# Patient Record
Sex: Female | Born: 1965
Health system: Southern US, Community
[De-identification: ages and names within clinical notes are randomized; demographics above are authoritative.]

## PROBLEM LIST (undated history)

## (undated) DIAGNOSIS — K449 Diaphragmatic hernia without obstruction or gangrene: Secondary | ICD-10-CM

## (undated) HISTORY — DX: Diaphragmatic hernia without obstruction or gangrene: K44.9

## (undated) HISTORY — PX: TUBAL LIGATION: SHX77

## (undated) HISTORY — PX: APPENDECTOMY: SHX54

---

## 2000-09-04 ENCOUNTER — Other Ambulatory Visit: Admission: RE | Admit: 2000-09-04 | Discharge: 2000-09-04 | Payer: Self-pay | Admitting: *Deleted

## 2000-09-15 ENCOUNTER — Encounter (INDEPENDENT_AMBULATORY_CARE_PROVIDER_SITE_OTHER): Payer: Self-pay | Admitting: *Deleted

## 2000-09-15 LAB — CONVERTED CEMR LAB

## 2000-12-20 ENCOUNTER — Encounter: Admission: RE | Admit: 2000-12-20 | Discharge: 2000-12-20 | Payer: Self-pay | Admitting: Family Medicine

## 2001-02-16 ENCOUNTER — Ambulatory Visit (HOSPITAL_COMMUNITY): Admission: RE | Admit: 2001-02-16 | Discharge: 2001-02-16 | Payer: Self-pay | Admitting: Gastroenterology

## 2001-02-16 ENCOUNTER — Encounter (INDEPENDENT_AMBULATORY_CARE_PROVIDER_SITE_OTHER): Payer: Self-pay | Admitting: Specialist

## 2002-04-04 ENCOUNTER — Other Ambulatory Visit: Admission: RE | Admit: 2002-04-04 | Discharge: 2002-04-04 | Payer: Self-pay | Admitting: *Deleted

## 2003-05-07 ENCOUNTER — Emergency Department (HOSPITAL_COMMUNITY): Admission: AC | Admit: 2003-05-07 | Discharge: 2003-05-07 | Payer: Self-pay

## 2003-11-17 ENCOUNTER — Encounter: Admission: RE | Admit: 2003-11-17 | Discharge: 2003-11-17 | Payer: Self-pay | Admitting: Gastroenterology

## 2006-10-13 ENCOUNTER — Encounter (INDEPENDENT_AMBULATORY_CARE_PROVIDER_SITE_OTHER): Payer: Self-pay | Admitting: *Deleted

## 2010-05-06 ENCOUNTER — Encounter: Admission: RE | Admit: 2010-05-06 | Discharge: 2010-05-06 | Payer: Self-pay | Admitting: Obstetrics and Gynecology

## 2010-12-31 NOTE — Procedures (Signed)
[facility]  Patient:    Alyssa Moody, Alyssa Moody                   MRN: [id number] Proc. Date: 02/16/01 Adm. Date:  [id number] Attending:  Nelda Marseille CC:         Sibyl Parr. Darrick Penna, M.D.   Procedure Report  PROCEDURE:  Esophagogastroduodenoscopy with biopsy.  INDICATION:  Patient with upper tract symptoms requesting work-up.  INFORMED CONSENT:  Consent was signed after risks, benefits, methods, and options were thoroughly discussed in the office.  MEDICINES USED:  Demerol 70, Versed 6.5.  DESCRIPTION OF PROCEDURE:  The video endoscope was inserted by direct vision. The esophagus was normal.  In the distal esophagus was a patent GE junction, possibly a tiny hiatal hernia was seen.  The scope passed easily into the stomach and advanced through a normal antrum, normal pylorus, into a normal duodenal bulb and around the c-loop into a normal second portion of the duodenum.  Interestingly enough, the ampulla was seen and was normal.  The scope was then slowly withdrawn back to the bulb, and a good look there ruled out abnormalities in that location.  The scope was withdrawn back to the stomach and retroflexed.  Two proximal gastric polyps were seen, tiny.  High in the cardia, possibly a tiny hiatal hernia was again seen.  No other abnormalities were seen on retroflexion with good look at the angularis, lesser and greater curve.  On straight visualization in the stomach, two additional more distal tiny polyps were seen.  All polyps mentioned above were cold biopsied x 1 or 2.  No additional findings were seen on straight visualization.  Again, the scope was slowly withdrawn, again confirming the normal esophagus.  The scope was removed.  The patient tolerated the procedure well.  There was no obvious immediate complications.  ENDOSCOPIC DIAGNOSES: 1. Patent gastroesophageal junction with questionably tiny hiatal hernia. 2. Four tiny gastric polyps, status  post cold biopsied. 3. Otherwise normal esophagogastroduodenoscopy.  PLAN:  Await pathology just to be sure.  Continue Prevacid since it is helping.  Follow up p.r.n. or in two months to recheck symptoms and make sure no further work-up plans are needed but probably would do an ultrasound next. DD:  02/16/01 TD:  02/16/01 Job: [id number]

## 2012-09-29 ENCOUNTER — Other Ambulatory Visit: Payer: Self-pay

## 2013-06-20 ENCOUNTER — Other Ambulatory Visit: Payer: Self-pay

## 2014-03-18 ENCOUNTER — Ambulatory Visit (INDEPENDENT_AMBULATORY_CARE_PROVIDER_SITE_OTHER): Payer: 59 | Admitting: Family Medicine

## 2014-03-18 VITALS — BP 120/76 | HR 73 | Temp 98.2°F | Resp 17 | Ht 62.5 in | Wt 136.0 lb

## 2014-03-18 DIAGNOSIS — Z131 Encounter for screening for diabetes mellitus: Secondary | ICD-10-CM

## 2014-03-18 DIAGNOSIS — Z0000 Encounter for general adult medical examination without abnormal findings: Secondary | ICD-10-CM

## 2014-03-18 DIAGNOSIS — K219 Gastro-esophageal reflux disease without esophagitis: Secondary | ICD-10-CM

## 2014-03-18 DIAGNOSIS — Z13220 Encounter for screening for lipoid disorders: Secondary | ICD-10-CM

## 2014-03-18 LAB — LIPID PANEL
Cholesterol: 191 mg/dL (ref 0–200)
HDL: 55 mg/dL (ref >39)
LDL Cholesterol: 115 mg/dL — ABNORMAL HIGH (ref 0–99)
Total CHOL/HDL Ratio: 3.5 Ratio
Triglycerides: 106 mg/dL (ref <150)
VLDL: 21 mg/dL (ref 0–40)

## 2014-03-18 LAB — POCT CBC
GRANULOCYTE PERCENT: 68.8 % (ref 37–80)
HCT, POC: 43.5 % (ref 37.7–47.9)
Hemoglobin: 14.3 g/dL (ref 12.2–16.2)
LYMPH, POC: 1.6 (ref 0.6–3.4)
MCH: 30.4 pg (ref 27–31.2)
MCHC: 32.9 g/dL (ref 31.8–35.4)
MCV: 92.3 fL (ref 80–97)
MID (CBC): 0.5 (ref 0–0.9)
MPV: 8.2 fL (ref 0–99.8)
PLATELET COUNT, POC: 253 10*3/uL (ref 142–424)
POC GRANULOCYTE: 4.7 (ref 2–6.9)
POC LYMPH %: 23.5 % (ref 10–50)
POC MID %: 7.7 % (ref 0–12)
RBC: 4.72 M/uL (ref 4.04–5.48)
RDW, POC: 13.5 %
WBC: 6.9 10*3/uL (ref 4.6–10.2)

## 2014-03-18 LAB — COMPREHENSIVE METABOLIC PANEL
ALT: 14 U/L (ref 0–35)
AST: 22 U/L (ref 0–37)
Albumin: 4.5 g/dL (ref 3.5–5.2)
Alkaline Phosphatase: 61 U/L (ref 39–117)
BUN: 12 mg/dL (ref 6–23)
CO2: 26 mEq/L (ref 19–32)
Calcium: 9.1 mg/dL (ref 8.4–10.5)
Chloride: 100 mEq/L (ref 96–112)
Creat: 0.77 mg/dL (ref 0.50–1.10)
Glucose, Bld: 80 mg/dL (ref 70–99)
Potassium: 3.6 mEq/L (ref 3.5–5.3)
Sodium: 135 mEq/L (ref 135–145)
Total Bilirubin: 0.5 mg/dL (ref 0.2–1.2)
Total Protein: 7.4 g/dL (ref 6.0–8.3)

## 2014-03-18 NOTE — Progress Notes (Addendum)
Subjective:    Patient ID: Alyssa Moody, female    DOB: 01/27/1966, 48 y.o.   MRN: [id number] This chart was scribed for Alyssa Staggers, MD by Julian Hy, ED Scribe. The patient was seen in Room 14. The patient's care was started at 2:18 PM.   Chief Complaint  Patient presents with  . Annual Exam    no pap requested     HPI HPI Comments: Alyssa Moody is a 48 y.o. female who presents to the [facility] for annual exam. Pt denies having any concerns. Pt reports she has past medical history of acid reflux, that she manages with diet. Pt reports 3-4 episodes/year. Pt reports she has self-caused vomiting about 3-4x/year.  Pt denies having a PCP. 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 Pt denies having a colonoscopy.  Pt reports fasting. Pt denies any history of anemia.   Health Maintenance  Cancer Screening Pt denies any family hx gastro, colon or breast cancer. Pt reports having a 01/2014 mammogram and pap smear with normal findings with OB/Gyn.  Immunizations Tetanus UTD. Pt reports her immunizations records are in [facility] records.   Exercise Pt states she doesn't regularly exercise.   Dentist Pt states she saw the dentist this morning for tooth cleaning.  Opthalmologist Pt denies seeing an opthalmologist. Pt reports she would like a referral to an ophthalmologist.   High Risk Problems Pt reports she is divorced. Pt has boyfriend. Pt reports she has tubal ligation. Pt denies using other forms of contraception.OB/GYN did STI check.   Review of Systems  Constitutional: Negative for fever and chills.  Respiratory: Negative for shortness of breath.   Gastrointestinal: Negative for nausea and vomiting.  Neurological: Negative for weakness.  All other systems reviewed and are negative.  Objective:   Physical Exam  Nursing note and vitals reviewed. Constitutional: She is oriented to person, place, and time. She  appears well-developed and well-nourished. No distress.  HENT:  Head: Normocephalic and atraumatic.  Right Ear: External ear normal.  Left Ear: External ear normal.  Mouth/Throat: Oropharynx is clear and moist.  Cerumen in both canals without complete obstruction.   Eyes: Conjunctivae and EOM are normal. Pupils are equal, round, and reactive to light.  Neck: Normal range of motion. Neck supple. No tracheal deviation present. No thyromegaly present.  Cardiovascular: Normal rate, regular rhythm, normal heart sounds and intact distal pulses.   No murmur heard. Pulmonary/Chest: Effort normal and breath sounds normal. No respiratory distress. She has no wheezes.  Breast exam and gyn exam deferred as under care of gynecologist who has performed these.  Abdominal: Soft. Bowel sounds are normal. There is no tenderness.  Musculoskeletal: Normal range of motion. She exhibits no edema and no tenderness.  Lymphadenopathy:    She has no cervical adenopathy.  Neurological: She is alert and oriented to person, place, and time.  Skin: Skin is warm and dry. No rash noted.  Psychiatric: She has a normal mood and affect. Her behavior is normal. Thought content normal.   Results for orders placed in visit on 03/18/14  POCT CBC      Result Value Ref Range   WBC 6.9  4.6 - 10.2 K/uL   Lymph, poc 1.6  0.6 - 3.4   POC LYMPH PERCENT 23.5  10 - 50 %L   MID (cbc) 0.5  0 - 0.9   POC MID % 7.7  0 - 12 %M  POC Granulocyte 4.7  2 - 6.9   Granulocyte percent 68.8  37 - 80 %G   RBC 4.72  4.04 - 5.48 M/uL   Hemoglobin 14.3  12.2 - 16.2 g/dL   HCT, POC 16.1  09.6 - 47.9 %   MCV 92.3  80 - 97 fL   MCH, POC 30.4  27 - 31.2 pg   MCHC 32.9  31.8 - 35.4 g/dL   RDW, POC 04.5     Platelet Count, POC 253  142 - 424 K/uL   MPV 8.2  0 - 99.8 fL     Assessment & Plan:  [name] is a 48 y.o. female Routine general medical examination at a health care facility  --anticipatory guidance as below in AVS, screening  labs above. Health maintenance items as above in HPI discussed/recommended as applicable.   Gastroesophageal reflux disease, esophagitis presence not specified - Plan: POCT CBC  -episodic.  Trigger avoidance discussed, and listed below, otc zantac if needed. rtc precautions.   Screening for diabetes mellitus (DM) - Plan: Comprehensive metabolic panel  Screening for hyperlipidemia - Plan: Lipid panel   No orders of the defined types were placed in this encounter.   Patient Instructions  Tetanus (tdap) needed if not within 10 years.  We will refer you to ophthalmologist for evaluation of vision and health of your eyes. Exercise 150 minutes per week.  Foods to avoid with heartburn below.  Debrox if needed for the wax in your ears, but return if this does not clear your symptoms in 1-2 days.  Return to the clinic or go to the nearest emergency room if any of your symptoms worsen or new symptoms occur.  Keeping You Healthy  Get These Tests 1. Blood Pressure- Have your blood pressure checked once a year by your health care provider.  Normal blood pressure is 120/80. 2. Weight- Have your body mass index (BMI) calculated to screen for obesity.  BMI is measure of body fat based on height and weight.  You can also calculate your own BMI at https://www.west-esparza.com/. 3. Cholesterol- Have your cholesterol checked every 5 years starting at age 71 then yearly starting at age 25. 4. Chlamydia, HIV, and other sexually transmitted diseases- Get screened every year until age 71, then within three months of each new sexual provider. 5. Pap Smear- Every 1-3 years; discuss with your health care provider. 6. Mammogram- Every year starting at age 24  Take these medicines  Calcium with Vitamin D-Your body needs 1200 mg of Calcium each day and [phone] IU of Vitamin D daily.  Your body can only absorb 500 mg of Calcium at a time so Calcium must be taken in 2 or 3 divided doses throughout the  day.  Multivitamin with folic acid- Once daily if it is possible for you to become pregnant.  Get these Immunizations  Gardasil-Series of three doses; prevents HPV related illness such as genital warts and cervical cancer.  Menactra-Single dose; prevents meningitis.  Tetanus shot- Every 10 years.  Flu shot-Every year.  Take these steps 1. Do not smoke-Your healthcare provider can help you quit.  For tips on how to quit go to www.smokefree.gov or call 1-800 QUITNOW. 2. Be physically active- Exercise 5 days a week for at least 30 minutes.  If you are not already physically active, start slow and gradually work up to 30 minutes of moderate physical activity.  Examples of moderate activity include walking briskly, dancing, swimming, bicycling, etc. 3. Breast  Cancer- A self breast exam every month is important for early detection of breast cancer.  For more information and instruction on self breast exams, ask your healthcare provider or [place].es. 4. Eat a healthy diet- Eat a variety of healthy foods such as fruits, vegetables, whole grains, low fat milk, low fat cheeses, yogurt, lean meats, poultry and fish, beans, nuts, tofu, etc.  For more information go to www. Thenutritionsource.org 5. Drink alcohol in moderation- Limit alcohol intake to one drink or less per day. Never drink and drive. 6. Depression- Your emotional health is as important as your physical health.  If you're feeling down or losing interest in things you normally enjoy please talk to your healthcare provider about being screened for depression. 7. Dental visit- Brush and floss your teeth twice daily; visit your dentist twice a year. 8. Eye doctor- Get an eye exam at least every 2 years. 9. Helmet use- Always wear a helmet when riding a bicycle, motorcycle, rollerblading or skateboarding. 10. Safe sex- If you may be exposed to sexually transmitted infections, use a condom. 11. Seat belts-  Seat belts can save your live; always wear one. 12. Smoke/Carbon Monoxide detectors- These detectors need to be installed on the appropriate level of your home. Replace batteries at least once a year. 13. Skin cancer- When out in the sun please cover up and use sunscreen 15 SPF or higher. 14. Violence- If anyone is threatening or hurting you, please tell your healthcare provider.  Food Choices for Gastroesophageal Reflux Disease When you have gastroesophageal reflux disease (GERD), the foods you eat and your eating habits are very important. Choosing the right foods can help ease the discomfort of GERD. WHAT GENERAL GUIDELINES DO I NEED TO FOLLOW?  Choose fruits, vegetables, whole grains, low-fat dairy products, and low-fat meat, fish, and poultry.  Limit fats such as oils, salad dressings, butter, nuts, and avocado.  Keep a food diary to identify foods that cause symptoms.  Avoid foods that cause reflux. These may be different for different people.  Eat frequent small meals instead of three large meals each day.  Eat your meals slowly, in a relaxed setting.  Limit fried foods.  Cook foods using methods other than frying.  Avoid drinking alcohol.  Avoid drinking large amounts of liquids with your meals.  Avoid bending over or lying down until 2-3 hours after eating. WHAT FOODS ARE NOT RECOMMENDED? The following are some foods and drinks that may worsen your symptoms: Vegetables Tomatoes. Tomato juice. Tomato and spaghetti sauce. Chili peppers. Onion and garlic. Horseradish. Fruits Oranges, grapefruit, and lemon (fruit and juice). Meats High-fat meats, fish, and poultry. This includes hot dogs, ribs, ham, sausage, salami, and bacon. Dairy Whole milk and chocolate milk. Sour cream. Cream. Butter. Ice cream. Cream cheese.  Beverages Coffee and tea, with or without caffeine. Carbonated beverages or energy drinks. Condiments Hot sauce. Barbecue sauce.   Sweets/Desserts Chocolate and cocoa. Donuts. Peppermint and spearmint. Fats and Oils High-fat foods, including [place] fries and potato chips. Other Vinegar. Strong spices, such as black pepper, white pepper, red pepper, cayenne, curry powder, cloves, ginger, and chili powder. The items listed above may not be a complete list of foods and beverages to avoid. Contact your dietitian for more information. Document Released: 08/01/2005 Document Revised: 08/06/2013 Document Reviewed: 06/05/2013 [facility] Patient Information 2015 [place], [place]. This information is not intended to replace advice given to you by your health care provider. Make sure you discuss any questions you have with your  health care provider.      I personally performed the services described in this documentation, which was scribed in my presence. The recorded information has been reviewed and considered, and addended by me as needed.

## 2014-03-18 NOTE — Patient Instructions (Addendum)
Tetanus (tdap) needed if not within 10 years.  We will refer you to ophthalmologist for evaluation of vision and health of your eyes. Exercise 150 minutes per week.  Foods to avoid with heartburn below.  Debrox if needed for the wax in your ears, but return if this does not clear your symptoms in 1-2 days.  Return to the clinic or go to the nearest emergency room if any of your symptoms worsen or new symptoms occur.  Keeping You Healthy  Get These Tests 1. Blood Pressure- Have your blood pressure checked once a year by your health care provider.  Normal blood pressure is 120/80. 2. Weight- Have your body mass index (BMI) calculated to screen for obesity.  BMI is measure of body fat based on height and weight.  You can also calculate your own BMI at https://www.west-esparza.com/www.nhlbisupport.com/bmi/. 3. Cholesterol- Have your cholesterol checked every 5 years starting at age 48 then yearly starting at age 48. 4. Chlamydia, HIV, and other sexually transmitted diseases- Get screened every year until age 48, then within three months of each new sexual provider. 5. Pap Smear- Every 1-3 years; discuss with your health care provider. 6. Mammogram- Every year starting at age 48  Take these medicines  Calcium with Vitamin D-Your body needs 1200 mg of Calcium each day and [phone] IU of Vitamin D daily.  Your body can only absorb 500 mg of Calcium at a time so Calcium must be taken in 2 or 3 divided doses throughout the day.  Multivitamin with folic acid- Once daily if it is possible for you to become pregnant.  Get these Immunizations  Gardasil-Series of three doses; prevents HPV related illness such as genital warts and cervical cancer.  Menactra-Single dose; prevents meningitis.  Tetanus shot- Every 10 years.  Flu shot-Every year.  Take these steps 1. Do not smoke-Your healthcare provider can help you quit.  For tips on how to quit go to www.smokefree.gov or call 1-800 QUITNOW. 2. Be physically active- Exercise 5  days a week for at least 30 minutes.  If you are not already physically active, start slow and gradually work up to 30 minutes of moderate physical activity.  Examples of moderate activity include walking briskly, dancing, swimming, bicycling, etc. 3. Breast Cancer- A self breast exam every month is important for early detection of breast cancer.  For more information and instruction on self breast exams, ask your healthcare provider or [place].eswww.womenshealth.gov/faq/breast-self-exam.cfm. 4. Eat a healthy diet- Eat a variety of healthy foods such as fruits, vegetables, whole grains, low fat milk, low fat cheeses, yogurt, lean meats, poultry and fish, beans, nuts, tofu, etc.  For more information go to www. Thenutritionsource.org 5. Drink alcohol in moderation- Limit alcohol intake to one drink or less per day. Never drink and drive. 6. Depression- Your emotional health is as important as your physical health.  If you're feeling down or losing interest in things you normally enjoy please talk to your healthcare provider about being screened for depression. 7. Dental visit- Brush and floss your teeth twice daily; visit your dentist twice a year. 8. Eye doctor- Get an eye exam at least every 2 years. 9. Helmet use- Always wear a helmet when riding a bicycle, motorcycle, rollerblading or skateboarding. 10. Safe sex- If you may be exposed to sexually transmitted infections, use a condom. 11. Seat belts- Seat belts can save your live; always wear one. 12. Smoke/Carbon Monoxide detectors- These detectors need to be installed on the appropriate level of  your home. Replace batteries at least once a year. 13. Skin cancer- When out in the sun please cover up and use sunscreen 15 SPF or higher. 14. Violence- If anyone is threatening or hurting you, please tell your healthcare provider.  Food Choices for Gastroesophageal Reflux Disease When you have gastroesophageal reflux disease (GERD), the foods you eat and your  eating habits are very important. Choosing the right foods can help ease the discomfort of GERD. WHAT GENERAL GUIDELINES DO I NEED TO FOLLOW?  Choose fruits, vegetables, whole grains, low-fat dairy products, and low-fat meat, fish, and poultry.  Limit fats such as oils, salad dressings, butter, nuts, and avocado.  Keep a food diary to identify foods that cause symptoms.  Avoid foods that cause reflux. These may be different for different people.  Eat frequent small meals instead of three large meals each day.  Eat your meals slowly, in a relaxed setting.  Limit fried foods.  Cook foods using methods other than frying.  Avoid drinking alcohol.  Avoid drinking large amounts of liquids with your meals.  Avoid bending over or lying down until 2-3 hours after eating. WHAT FOODS ARE NOT RECOMMENDED? The following are some foods and drinks that may worsen your symptoms: Vegetables Tomatoes. Tomato juice. Tomato and spaghetti sauce. Chili peppers. Onion and garlic. Horseradish. Fruits Oranges, grapefruit, and lemon (fruit and juice). Meats High-fat meats, fish, and poultry. This includes hot dogs, ribs, ham, sausage, salami, and bacon. Dairy Whole milk and chocolate milk. Sour cream. Cream. Butter. Ice cream. Cream cheese.  Beverages Coffee and tea, with or without caffeine. Carbonated beverages or energy drinks. Condiments Hot sauce. Barbecue sauce.  Sweets/Desserts Chocolate and cocoa. Donuts. Peppermint and spearmint. Fats and Oils High-fat foods, including [place] fries and potato chips. Other Vinegar. Strong spices, such as black pepper, white pepper, red pepper, cayenne, curry powder, cloves, ginger, and chili powder. The items listed above may not be a complete list of foods and beverages to avoid. Contact your dietitian for more information. Document Released: 08/01/2005 Document Revised: 08/06/2013 Document Reviewed: 06/05/2013 [facility] Patient Information 2015  [place], [place]. This information is not intended to replace advice given to you by your health care provider. Make sure you discuss any questions you have with your health care provider.

## 2014-03-28 ENCOUNTER — Encounter: Payer: Self-pay | Admitting: *Deleted

## 2015-05-04 ENCOUNTER — Ambulatory Visit (INDEPENDENT_AMBULATORY_CARE_PROVIDER_SITE_OTHER): Payer: 59 | Admitting: Family Medicine

## 2015-05-04 ENCOUNTER — Encounter: Payer: Self-pay | Admitting: Family Medicine

## 2015-05-04 VITALS — BP 107/67 | HR 76 | Temp 98.5°F | Resp 16 | Ht 62.5 in | Wt 141.0 lb

## 2015-05-04 DIAGNOSIS — Z0000 Encounter for general adult medical examination without abnormal findings: Secondary | ICD-10-CM

## 2015-05-04 DIAGNOSIS — M25569 Pain in unspecified knee: Secondary | ICD-10-CM

## 2015-05-04 DIAGNOSIS — R5383 Other fatigue: Secondary | ICD-10-CM

## 2015-05-04 DIAGNOSIS — H53143 Visual discomfort, bilateral: Secondary | ICD-10-CM | POA: Diagnosis not present

## 2015-05-04 DIAGNOSIS — Z130 Encounter for screening for diseases of the blood and blood-forming organs and certain disorders involving the immune mechanism: Secondary | ICD-10-CM

## 2015-05-04 DIAGNOSIS — Z13220 Encounter for screening for lipoid disorders: Secondary | ICD-10-CM | POA: Diagnosis not present

## 2015-05-04 DIAGNOSIS — Z131 Encounter for screening for diabetes mellitus: Secondary | ICD-10-CM | POA: Diagnosis not present

## 2015-05-04 DIAGNOSIS — H5310 Unspecified subjective visual disturbances: Secondary | ICD-10-CM

## 2015-05-04 DIAGNOSIS — G4726 Circadian rhythm sleep disorder, shift work type: Secondary | ICD-10-CM

## 2015-05-04 NOTE — Patient Instructions (Addendum)
Your shift work and loss of sleep may be a factor with your fatigue and feeling down. I would recommend changing to either nights or days - not mixing the schedule. I will ceck other tests on blood work when you can return for fasting bloodwork.  You should receive a call or letter about your lab results within the next week to 10 days.   Try stretching for leg pain and exercise as discussed.  If this is not helping in next few weeks - return for recheck.  Your memory symptoms/retention and keeping up may be due to sleep difficulty, as well as stress or depression symptoms.  You need more regular sleep as we discussed and see information on stress management below. If you do feel depressed or these symptoms persist - return for recheck.   I will refer you to eye doctor as discussed   Fatigue Fatigue is a feeling of tiredness, lack of energy, lack of motivation, or feeling tired all the time. Having enough rest, good nutrition, and reducing stress will normally reduce fatigue. Consult your caregiver if it persists. The nature of your fatigue will help your caregiver to find out its cause. The treatment is based on the cause.  CAUSES  There are many causes for fatigue. Most of the time, fatigue can be traced to one or more of your habits or routines. Most causes fit into one or more of three general areas. They are: Lifestyle problems  Sleep disturbances.  Overwork.  Physical exertion.  Unhealthy habits.  Poor eating habits or eating disorders.  Alcohol and/or drug use .  Lack of proper nutrition (malnutrition). Psychological problems 1. Stress and/or anxiety problems. 2. Depression. 3. Grief. 4. Boredom. Medical Problems or Conditions 1. Anemia. 2. Pregnancy. 3. Thyroid gland problems. 4. Recovery from major surgery. 5. Continuous pain. 6. Emphysema or asthma that is not well controlled 7. Allergic conditions. 8. Diabetes. 9. Infections (such as  mononucleosis). 10. Obesity. 11. Sleep disorders, such as sleep apnea. 12. Heart failure or other heart-related problems. 13. Cancer. 14. Kidney disease. 15. Liver disease. 16. Effects of certain medicines such as antihistamines, cough and cold remedies, prescription pain medicines, heart and blood pressure medicines, drugs used for treatment of cancer, and some antidepressants. SYMPTOMS  The symptoms of fatigue include:  1. Lack of energy. 2. Lack of drive (motivation). 3. Drowsiness. 4. Feeling of indifference to the surroundings. DIAGNOSIS  The details of how you feel help guide your caregiver in finding out what is causing the fatigue. You will be asked about your present and past health condition. It is important to review all medicines that you take, including prescription and non-prescription items. A thorough exam will be done. You will be questioned about your feelings, habits, and normal lifestyle. Your caregiver may suggest blood tests, urine tests, or other tests to look for common medical causes of fatigue.  TREATMENT  Fatigue is treated by correcting the underlying cause. For example, if you have continuous pain or depression, treating these causes will improve how you feel. Similarly, adjusting the dose of certain medicines will help in reducing fatigue.  HOME CARE INSTRUCTIONS   Try to get the required amount of good sleep every night.  Eat a healthy and nutritious diet, and drink enough water throughout the day.  Practice ways of relaxing (including yoga or meditation).  Exercise regularly.  Make plans to change situations that cause stress. Act on those plans so that stresses decrease over time. Keep your work  and personal routine reasonable.  Avoid street drugs and minimize use of alcohol.  Start taking a daily multivitamin after consulting your caregiver. SEEK MEDICAL CARE IF:  1. You have persistent tiredness, which cannot be accounted for. 2. You have  fever. 3. You have unintentional weight loss. 4. You have headaches. 5. You have disturbed sleep throughout the night. 6. You are feeling sad. 7. You have constipation. 8. You have dry skin. 9. You have gained weight. 10. You are taking any new or different medicines that you suspect are causing fatigue. 11. You are unable to sleep at night. 12. You develop any unusual swelling of your legs or other parts of your body. SEEK IMMEDIATE MEDICAL CARE IF:   You are feeling confused.  Your vision is blurred.  You feel faint or pass out.  You develop severe headache.  You develop severe abdominal, pelvic, or back pain.  You develop chest pain, shortness of breath, or an irregular or fast heartbeat.  You are unable to pass a normal amount of urine.  You develop abnormal bleeding such as bleeding from the rectum or you vomit blood.  You have thoughts about harming yourself or committing suicide.  You are worried that you might harm someone else. MAKE SURE YOU:   Understand these instructions.  Will watch your condition.  Will get help right away if you are not doing well or get worse. Document Released: 05/29/2007 Document Revised: 10/24/2011 Document Reviewed: 12/03/2013 [facility] Patient Information 2015 [place], [place]. This information is not intended to replace advice given to you by your health care provider. Make sure you discuss any questions you have with your health care provider.  Stress and Stress Management Stress is a normal reaction to life events. It is what you feel when life demands more than you are used to or more than you can handle. Some stress can be useful. For example, the stress reaction can help you catch the last bus of the day, study for a test, or meet a deadline at work. But stress that occurs too often or for too long can cause problems. It can affect your emotional health and interfere with relationships and normal daily activities. Too much stress  can weaken your immune system and increase your risk for physical illness. If you already have a medical problem, stress can make it worse. CAUSES  All sorts of life events may cause stress. An event that causes stress for one person may not be stressful for another person. Major life events commonly cause stress. These may be positive or negative. Examples include losing your job, moving into a new home, getting married, having a baby, or losing a loved one. Less obvious life events may also cause stress, especially if they occur day after day or in combination. Examples include working long hours, driving in traffic, caring for children, being in debt, or being in a difficult relationship. SIGNS AND SYMPTOMS Stress may cause emotional symptoms including, the following:  Anxiety. This is feeling worried, afraid, on edge, overwhelmed, or out of control.  Anger. This is feeling irritated or impatient.  Depression. This is feeling sad, down, helpless, or guilty.  Difficulty focusing, remembering, or making decisions. Stress may cause physical symptoms, including the following:  5. Aches and pains. These may affect your head, neck, back, stomach, or other areas of your body. 6. Tight muscles or clenched jaw. 7. Low energy or trouble sleeping. Stress may cause unhealthy behaviors, including the following:  17. Eating  to feel better (overeating) or skipping meals. 18. Sleeping too little, too much, or both. 52. Working too much or putting off tasks (procrastination). 20. Smoking, drinking alcohol, or using drugs to feel better. DIAGNOSIS  Stress is diagnosed through an assessment by your health care provider. Your health care provider will ask questions about your symptoms and any stressful life events.Your health care provider will also ask about your medical history and may order blood tests or other tests. Certain medical conditions and medicine can cause physical symptoms similar to stress.  Mental illness can cause emotional symptoms and unhealthy behaviors similar to stress. Your health care provider may refer you to a mental health professional for further evaluation.  TREATMENT  Stress management is the recommended treatment for stress.The goals of stress management are reducing stressful life events and coping with stress in healthy ways.  Techniques for reducing stressful life events include the following: 5. Stress identification. Self-monitor for stress and identify what causes stress for you. These skills may help you to avoid some stressful events. 6. Time management. Set your priorities, keep a calendar of events, and learn to say "no." These tools can help you avoid making too many commitments. Techniques for coping with stress include the following:  Rethinking the problem. Try to think realistically about stressful events rather than ignoring them or overreacting. Try to find the positives in a stressful situation rather than focusing on the negatives.  Exercise. Physical exercise can release both physical and emotional tension. The key is to find a form of exercise you enjoy and do it regularly.  Relaxation techniques. These relax the body and mind. Examples include yoga, meditation, tai chi, biofeedback, deep breathing, progressive muscle relaxation, listening to music, being out in nature, journaling, and other hobbies. Again, the key is to find one or more that you enjoy and can do regularly.  Healthy lifestyle. Eat a balanced diet, get plenty of sleep, and do not smoke. Avoid using alcohol or drugs to relax.  Strong support network. Spend time with family, friends, or other people you enjoy being around.Express your feelings and talk things over with someone you trust. Counseling or talktherapy with a mental health professional may be helpful if you are having difficulty managing stress on your own. Medicine is typically not recommended for the treatment of  stress.Talk to your health care provider if you think you need medicine for symptoms of stress. HOME CARE INSTRUCTIONS 13. Keep all follow-up visits as directed by your health care provider. 14. Take all medicines as directed by your health care provider. SEEK MEDICAL CARE IF:  Your symptoms get worse or you start having new symptoms.  You feel overwhelmed by your problems and can no longer manage them on your own. SEEK IMMEDIATE MEDICAL CARE IF:  You feel like hurting yourself or someone else. Document Released: 01/25/2001 Document Revised: 12/16/2013 Document Reviewed: 03/26/2013 [facility] Patient Information 2015 [place], [place]. This information is not intended to replace advice given to you by your health care provider. Make sure you discuss any questions you have with your health care provider.  Keeping You Healthy  Get These Tests 8. Blood Pressure- Have your blood pressure checked once a year by your health care provider.  Normal blood pressure is 120/80. 9. Weight- Have your body mass index (BMI) calculated to screen for obesity.  BMI is measure of body fat based on height and weight.  You can also calculate your own BMI at [employer]velB[employer].[email or web address]. 10. Cholesterol- Have your cholesterol  checked every 5 years starting at age 46 then yearly starting at age 39. 40. Chlamydia, HIV, and other sexually transmitted diseases- Get screened every year until age 18, then within three months of each new sexual provider. 12. Pap Test - Every 1-5 years; discuss with your health care provider. 71. Mammogram- Every 1-2 years starting at age 50--50  Take these medicines  Calcium with Vitamin D-Your body needs 1200 mg of Calcium each day and [phone] IU of Vitamin D daily.  Your body can only absorb 500 mg of Calcium at a time so Calcium must be taken in 2 or 3 divided doses throughout the day.  Multivitamin with folic acid- Once daily if it is possible for you to become pregnant.  Get these  Immunizations  Gardasil-Series of three doses; prevents HPV related illness such as genital warts and cervical cancer.  Menactra-Single dose; prevents meningitis.  Tetanus shot- Every 10 years.  Flu shot-Every year.  Take these steps 15. Do not smoke-Your healthcare provider can help you quit.  For tips on how to quit go to www.smokefree.gov or call 1-800 QUITNOW. 16. Be physically active- Exercise 5 days a week for at least 30 minutes.  If you are not already physically active, start slow and gradually work up to 30 minutes of moderate physical activity.  Examples of moderate activity include walking briskly, dancing, swimming, bicycling, etc. 17. Breast Cancer- A self breast exam every month is important for early detection of breast cancer.  For more information and instruction on self breast exams, ask your healthcare provider or https://www.patel.info/. 18. Eat a healthy diet- Eat a variety of healthy foods such as fruits, vegetables, whole grains, low fat milk, low fat cheeses, yogurt, lean meats, poultry and fish, beans, nuts, tofu, etc.  For more information go to www. Thenutritionsource.org 19. Drink alcohol in moderation- Limit alcohol intake to one drink or less per day. Never drink and drive. 51. Depression- Your emotional health is as important as your physical health.  If you're feeling down or losing interest in things you normally enjoy please talk to your healthcare provider about being screened for depression. 21. Dental visit- Brush and floss your teeth twice daily; visit your dentist twice a year. 71. Eye doctor- Get an eye exam at least every 2 years. 23. Helmet use- Always wear a helmet when riding a bicycle, motorcycle, rollerblading or skateboarding. 24. Safe sex- If you may be exposed to sexually transmitted infections, use a condom. 25. Seat belts- Seat belts can save your live; always wear one. 26. Smoke/Carbon Monoxide detectors- These detectors  need to be installed on the appropriate level of your home. Replace batteries at least once a year. 27. Skin cancer- When out in the sun please cover up and use sunscreen 15 SPF or higher. 28. Violence- If anyone is threatening or hurting you, please tell your healthcare provider.

## 2015-05-04 NOTE — Progress Notes (Signed)
Subjective:  This chart was scribed for [name], MD by Moises Blood, Medical Scribe. This patient was seen in room 26 and the patient's care was started 10:30 AM.    Patient ID: Alyssa Moody, female    DOB: 1965/08/20, 49 y.o.   MRN: [id number]  HPI Alyssa Moody is a 49 y.o. female Here for physical exam Last physical was in Aug 2015  Cancer Screening Pap testing by OB GYN, nl last year, repeat 3 years No Fhx of GI/colon/breast cancer Last mammogram: done earlier this year  Immunizations Reportedly up to date at last physical Works for [employer] She received a flu shot last week.   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   Exercise Recommended last year as she had not had any regular exercise at that time. She's working a lot and she's on her feet a lot.   Fatigue She works Monday to Friday day shift, and Saturday-Sunday night shift. She's been feeling more fatigue since last few years. She's not sleeping well, about 4 hours after Saturday shift, and 3 hours after Sunday shift.  Wt Readings from Last 3 Encounters:  05/04/15 141 lb (63.957 kg)  03/18/14 136 lb (61.689 kg)   She stress eats snacks when she gets stressed.   Leg Pain Her father had vein problems. She has had leg pains for a while (maybe up to 5 years), more on the back of her left leg hurts more than right leg. Last night, she had pain, she put on compression socks and felt better.   Eye Referred to Ophthalmology last year. She denies seeing ophthalmology. She started using reading glasses because it's hard to see up close. When she reads a lot even with the glasses, she gets tired. She can see far.   Depression Depression screen [facility] 2/9 05/04/2015  Decreased Interest 0  Down, Depressed, Hopeless 0  PHQ - 2 Score 0   She says she feels down sometimes due to stress. She denies major depression.     GERD Episodic when discussed last year, trigger avoidance was discussed  HLD Borderline elevated last year Lab Results  Component Value Date   CHOL 191 03/18/2014   HDL 55 03/18/2014   LDLCALC 115* 03/18/2014   TRIG 106 03/18/2014   CHOLHDL 3.5 03/18/2014  She's not fasting today. She worked last night.    There are no active problems to display for this patient.  Past Medical History  Diagnosis Date  . Hiatal hernia    Past Surgical History  Procedure Laterality Date  . Appendectomy    . Tubal ligation     No Known Allergies Prior to Admission medications   Not on File   Social History   Social History  . Marital Status: Single    Spouse Name: N/A  . Number of Children: N/A  . Years of Education: N/A   Occupational History  . Not on file.   Social History Main Topics  . Smoking status: Never Smoker   . Smokeless tobacco: Not on file  . Alcohol Use: No  . Drug Use: No  . Sexual Activity: No   Other Topics Concern  . Not on file   Social History Narrative     Review of Systems  Cardiovascular: Negative for chest pain and palpitations.   13 point ROS per survey. All negative except above  Objective:   Physical Exam  Constitutional: She is oriented to person, place, and time. She appears well-developed and well-nourished. No distress.  HENT:  Head: Normocephalic and atraumatic.  Right Ear: Hearing, tympanic membrane, external ear and ear canal normal.  Left Ear: Hearing, tympanic membrane, external ear and ear canal normal.  Nose: Nose normal.  Mouth/Throat: Oropharynx is clear and moist. No oropharyngeal exudate.  Eyes: Conjunctivae and EOM are normal. Pupils are equal, round, and reactive to light.  Neck: No thyromegaly present.  Cardiovascular: Normal rate, regular rhythm, normal heart sounds and intact distal pulses.   No murmur heard. Pulmonary/Chest: Effort normal and breath sounds normal. No respiratory distress. She has no wheezes.  She has no rhonchi.  Musculoskeletal:  Calves no edema, neg homans, calves non tender right knee: no effusion, neg mcmurray, full ROM left knee: no effusion, neg mcmurray, full ROM slight tight hamstrings  Neurological: She is alert and oriented to person, place, and time.  Skin: Skin is warm and dry. No rash noted.  Psychiatric: She has a normal mood and affect. Her behavior is normal.  Vitals reviewed.   Filed Vitals:   05/04/15 0932  BP: 107/67  Pulse: 76  Temp: 98.5 F (36.9 C)  Resp: 16  Height: 5' 2.5" (1.588 m)  Weight: 141 lb (63.957 kg)    Visual Acuity Screening   Right eye Left eye Both eyes  Without correction: _0  With correction:            Assessment & Plan:   By signing my name below, I, Moises Blood, attest that this documentation has been prepared under the direction and in the presence of [name], MD. Electronically Signed: Moises Blood, Scribe. 05/04/2015 , 10:30 AM . Alyssa Moody is a 49 y.o. female Shifting sleep-work schedule  -Suspect this may be contributing to multiple symptoms including fatigue, and stressors.  Discussed importance of regular sleep schedule, and I am concerned of her switching from days to nights each week. Recommended she try to decrease the amount of night shifts and if unable to do so, would need more sleep after these shifts.  Annual physical exam  --anticipatory guidance as below in AVS, screening labs above. Health maintenance items as above in HPI discussed/recommended as applicable.   Other fatigue - Plan: TSH, COMPLETE METABOLIC PANEL WITH GFR, CBC  - Labs pending, but suspect sleep disturbance with shifting sleep work schedule as primary cause. Also discussed stress and stress management, and other info on sleep as above.   Screening for diabetes mellitus - Plan: COMPLETE METABOLIC PANEL WITH GFR   Screening for hyperlipidemia - Plan: Lipid panel  Screening, anemia, deficiency, iron -  Plan: CBC  Eye fatigue, bilateral - Plan: Ambulatory referral to Ophthalmology  -Refer to operative for screening visual exam, but suspect some this fatigue is due to farsightedness. Okay to continue use of readers as needed.  Pain in joint, lower leg, unspecified laterality  - Nonfocal exam. If persistent after increasing sleep, as well as regular exercise and stretches, follow-up to discuss further and further evaluation.  No orders of the defined types were placed in this encounter.   Patient Instructions  Your shift work and loss of sleep may be a factor with your fatigue and feeling down. I would recommend changing to either nights or days - not mixing the schedule. I will ceck other tests on blood work when you can return for fasting bloodwork.  You should receive a  call or letter about your lab results within the next week to 10 days.   Try stretching for leg pain and exercise as discussed.  If this is not helping in next few weeks - return for recheck.  Your memory symptoms/retention and keeping up may be due to sleep difficulty, as well as stress or depression symptoms.  You need more regular sleep as we discussed and see information on stress management below. If you do feel depressed or these symptoms persist - return for recheck.   I will refer you to eye doctor as discussed   Fatigue Fatigue is a feeling of tiredness, lack of energy, lack of motivation, or feeling tired all the time. Having enough rest, good nutrition, and reducing stress will normally reduce fatigue. Consult your caregiver if it persists. The nature of your fatigue will help your caregiver to find out its cause. The treatment is based on the cause.  CAUSES  There are many causes for fatigue. Most of the time, fatigue can be traced to one or more of your habits or routines. Most causes fit into one or more of three general areas. They are: Lifestyle problems  Sleep disturbances.  Overwork.  Physical  exertion.  Unhealthy habits.  Poor eating habits or eating disorders.  Alcohol and/or drug use .  Lack of proper nutrition (malnutrition). Psychological problems 1. Stress and/or anxiety problems. 2. Depression. 3. Grief. 4. Boredom. Medical Problems or Conditions 1. Anemia. 2. Pregnancy. 3. Thyroid gland problems. 4. Recovery from major surgery. 5. Continuous pain. 6. Emphysema or asthma that is not well controlled 7. Allergic conditions. 8. Diabetes. 9. Infections (such as mononucleosis). 10. Obesity. 11. Sleep disorders, such as sleep apnea. 12. Heart failure or other heart-related problems. 13. Cancer. 14. Kidney disease. 15. Liver disease. 16. Effects of certain medicines such as antihistamines, cough and cold remedies, prescription pain medicines, heart and blood pressure medicines, drugs used for treatment of cancer, and some antidepressants. SYMPTOMS  The symptoms of fatigue include:  1. Lack of energy. 2. Lack of drive (motivation). 3. Drowsiness. 4. Feeling of indifference to the surroundings. DIAGNOSIS  The details of how you feel help guide your caregiver in finding out what is causing the fatigue. You will be asked about your present and past health condition. It is important to review all medicines that you take, including prescription and non-prescription items. A thorough exam will be done. You will be questioned about your feelings, habits, and normal lifestyle. Your caregiver may suggest blood tests, urine tests, or other tests to look for common medical causes of fatigue.  TREATMENT  Fatigue is treated by correcting the underlying cause. For example, if you have continuous pain or depression, treating these causes will improve how you feel. Similarly, adjusting the dose of certain medicines will help in reducing fatigue.  HOME CARE INSTRUCTIONS   Try to get the required amount of good sleep every night.  Eat a healthy and nutritious diet, and drink  enough water throughout the day.  Practice ways of relaxing (including yoga or meditation).  Exercise regularly.  Make plans to change situations that cause stress. Act on those plans so that stresses decrease over time. Keep your work and personal routine reasonable.  Avoid street drugs and minimize use of alcohol.  Start taking a daily multivitamin after consulting your caregiver. SEEK MEDICAL CARE IF:  1. You have persistent tiredness, which cannot be accounted for. 2. You have fever. 3. You have unintentional weight loss. 4. You  have headaches. 5. You have disturbed sleep throughout the night. 6. You are feeling sad. 7. You have constipation. 8. You have dry skin. 9. You have gained weight. 10. You are taking any new or different medicines that you suspect are causing fatigue. 11. You are unable to sleep at night. 12. You develop any unusual swelling of your legs or other parts of your body. SEEK IMMEDIATE MEDICAL CARE IF:   You are feeling confused.  Your vision is blurred.  You feel faint or pass out.  You develop severe headache.  You develop severe abdominal, pelvic, or back pain.  You develop chest pain, shortness of breath, or an irregular or fast heartbeat.  You are unable to pass a normal amount of urine.  You develop abnormal bleeding such as bleeding from the rectum or you vomit blood.  You have thoughts about harming yourself or committing suicide.  You are worried that you might harm someone else. MAKE SURE YOU:   Understand these instructions.  Will watch your condition.  Will get help right away if you are not doing well or get worse. Document Released: 05/29/2007 Document Revised: 10/24/2011 Document Reviewed: 12/03/2013 [facility] Patient Information 2015 [place], [place]. This information is not intended to replace advice given to you by your health care provider. Make sure you discuss any questions you have with your health care  provider.  Stress and Stress Management Stress is a normal reaction to life events. It is what you feel when life demands more than you are used to or more than you can handle. Some stress can be useful. For example, the stress reaction can help you catch the last bus of the day, study for a test, or meet a deadline at work. But stress that occurs too often or for too long can cause problems. It can affect your emotional health and interfere with relationships and normal daily activities. Too much stress can weaken your immune system and increase your risk for physical illness. If you already have a medical problem, stress can make it worse. CAUSES  All sorts of life events may cause stress. An event that causes stress for one person may not be stressful for another person. Major life events commonly cause stress. These may be positive or negative. Examples include losing your job, moving into a new home, getting married, having a baby, or losing a loved one. Less obvious life events may also cause stress, especially if they occur day after day or in combination. Examples include working long hours, driving in traffic, caring for children, being in debt, or being in a difficult relationship. SIGNS AND SYMPTOMS Stress may cause emotional symptoms including, the following:  Anxiety. This is feeling worried, afraid, on edge, overwhelmed, or out of control.  Anger. This is feeling irritated or impatient.  Depression. This is feeling sad, down, helpless, or guilty.  Difficulty focusing, remembering, or making decisions. Stress may cause physical symptoms, including the following:  5. Aches and pains. These may affect your head, neck, back, stomach, or other areas of your body. 6. Tight muscles or clenched jaw. 7. Low energy or trouble sleeping. Stress may cause unhealthy behaviors, including the following:  17. Eating to feel better (overeating) or skipping meals. 18. Sleeping too little, too much,  or both. 69. Working too much or putting off tasks (procrastination). 20. Smoking, drinking alcohol, or using drugs to feel better. DIAGNOSIS  Stress is diagnosed through an assessment by your health care provider. Your health care  provider will ask questions about your symptoms and any stressful life events.Your health care provider will also ask about your medical history and may order blood tests or other tests. Certain medical conditions and medicine can cause physical symptoms similar to stress. Mental illness can cause emotional symptoms and unhealthy behaviors similar to stress. Your health care provider may refer you to a mental health professional for further evaluation.  TREATMENT  Stress management is the recommended treatment for stress.The goals of stress management are reducing stressful life events and coping with stress in healthy ways.  Techniques for reducing stressful life events include the following: 5. Stress identification. Self-monitor for stress and identify what causes stress for you. These skills may help you to avoid some stressful events. 6. Time management. Set your priorities, keep a calendar of events, and learn to say "no." These tools can help you avoid making too many commitments. Techniques for coping with stress include the following:  Rethinking the problem. Try to think realistically about stressful events rather than ignoring them or overreacting. Try to find the positives in a stressful situation rather than focusing on the negatives.  Exercise. Physical exercise can release both physical and emotional tension. The key is to find a form of exercise you enjoy and do it regularly.  Relaxation techniques. These relax the body and mind. Examples include yoga, meditation, tai chi, biofeedback, deep breathing, progressive muscle relaxation, listening to music, being out in nature, journaling, and other hobbies. Again, the key is to find one or more that you enjoy  and can do regularly.  Healthy lifestyle. Eat a balanced diet, get plenty of sleep, and do not smoke. Avoid using alcohol or drugs to relax.  Strong support network. Spend time with family, friends, or other people you enjoy being around.Express your feelings and talk things over with someone you trust. Counseling or talktherapy with a mental health professional may be helpful if you are having difficulty managing stress on your own. Medicine is typically not recommended for the treatment of stress.Talk to your health care provider if you think you need medicine for symptoms of stress. HOME CARE INSTRUCTIONS 13. Keep all follow-up visits as directed by your health care provider. 14. Take all medicines as directed by your health care provider. SEEK MEDICAL CARE IF:  Your symptoms get worse or you start having new symptoms.  You feel overwhelmed by your problems and can no longer manage them on your own. SEEK IMMEDIATE MEDICAL CARE IF:  You feel like hurting yourself or someone else. Document Released: 01/25/2001 Document Revised: 12/16/2013 Document Reviewed: 03/26/2013 [facility] Patient Information 2015 [place], [place]. This information is not intended to replace advice given to you by your health care provider. Make sure you discuss any questions you have with your health care provider.  Keeping You Healthy  Get These Tests 8. Blood Pressure- Have your blood pressure checked once a year by your health care provider.  Normal blood pressure is 120/80. 9. Weight- Have your body mass index (BMI) calculated to screen for obesity.  BMI is measure of body fat based on height and weight.  You can also calculate your own BMI at GravelBags.it. 10. Cholesterol- Have your cholesterol checked every 5 years starting at age 4 then yearly starting at age 64. 66. Chlamydia, HIV, and other sexually transmitted diseases- Get screened every year until age 62, then within three months of each  new sexual provider. 12. Pap Test - Every 1-5 years; discuss with your health care  provider. 52. Mammogram- Every 1-2 years starting at age 60--50  Take these medicines  Calcium with Vitamin D-Your body needs 1200 mg of Calcium each day and [phone] IU of Vitamin D daily.  Your body can only absorb 500 mg of Calcium at a time so Calcium must be taken in 2 or 3 divided doses throughout the day.  Multivitamin with folic acid- Once daily if it is possible for you to become pregnant.  Get these Immunizations  Gardasil-Series of three doses; prevents HPV related illness such as genital warts and cervical cancer.  Menactra-Single dose; prevents meningitis.  Tetanus shot- Every 10 years.  Flu shot-Every year.  Take these steps 15. Do not smoke-Your healthcare provider can help you quit.  For tips on how to quit go to www.smokefree.gov or call 1-800 QUITNOW. 16. Be physically active- Exercise 5 days a week for at least 30 minutes.  If you are not already physically active, start slow and gradually work up to 30 minutes of moderate physical activity.  Examples of moderate activity include walking briskly, dancing, swimming, bicycling, etc. 17. Breast Cancer- A self breast exam every month is important for early detection of breast cancer.  For more information and instruction on self breast exams, ask your healthcare provider or https://www.patel.info/. 18. Eat a healthy diet- Eat a variety of healthy foods such as fruits, vegetables, whole grains, low fat milk, low fat cheeses, yogurt, lean meats, poultry and fish, beans, nuts, tofu, etc.  For more information go to www. Thenutritionsource.org 19. Drink alcohol in moderation- Limit alcohol intake to one drink or less per day. Never drink and drive. 68. Depression- Your emotional health is as important as your physical health.  If you're feeling down or losing interest in things you normally enjoy please talk to your healthcare  provider about being screened for depression. 21. Dental visit- Brush and floss your teeth twice daily; visit your dentist twice a year. 1. Eye doctor- Get an eye exam at least every 2 years. 23. Helmet use- Always wear a helmet when riding a bicycle, motorcycle, rollerblading or skateboarding. 24. Safe sex- If you may be exposed to sexually transmitted infections, use a condom. 25. Seat belts- Seat belts can save your live; always wear one. 26. Smoke/Carbon Monoxide detectors- These detectors need to be installed on the appropriate level of your home. Replace batteries at least once a year. 27. Skin cancer- When out in the sun please cover up and use sunscreen 15 SPF or higher. 28. Violence- If anyone is threatening or hurting you, please tell your healthcare provider.            I personally performed the services described in this documentation, which was scribed in my presence. The recorded information has been reviewed and considered, and addended by me as needed.

## 2015-05-05 ENCOUNTER — Other Ambulatory Visit (INDEPENDENT_AMBULATORY_CARE_PROVIDER_SITE_OTHER): Payer: 59 | Admitting: Family Medicine

## 2015-05-05 DIAGNOSIS — Z131 Encounter for screening for diabetes mellitus: Secondary | ICD-10-CM

## 2015-05-05 DIAGNOSIS — R5383 Other fatigue: Secondary | ICD-10-CM | POA: Diagnosis not present

## 2015-05-05 DIAGNOSIS — Z130 Encounter for screening for diseases of the blood and blood-forming organs and certain disorders involving the immune mechanism: Secondary | ICD-10-CM

## 2015-05-05 DIAGNOSIS — Z13220 Encounter for screening for lipoid disorders: Secondary | ICD-10-CM

## 2015-05-05 LAB — CBC
HEMATOCRIT: 42.4 % (ref 36.0–46.0)
Hemoglobin: 14.1 g/dL (ref 12.0–15.0)
MCH: 30.1 pg (ref 26.0–34.0)
MCHC: 33.3 g/dL (ref 30.0–36.0)
MCV: 90.6 fL (ref 78.0–100.0)
MPV: 10.3 fL (ref 8.6–12.4)
Platelets: 285 10*3/uL (ref 150–400)
RBC: 4.68 MIL/uL (ref 3.87–5.11)
RDW: 13.3 % (ref 11.5–15.5)
WBC: 5.9 10*3/uL (ref 4.0–10.5)

## 2015-05-05 LAB — COMPLETE METABOLIC PANEL WITH GFR
ALBUMIN: 4.2 g/dL (ref 3.6–5.1)
ALK PHOS: 63 U/L (ref 33–115)
ALT: 17 U/L (ref 6–29)
AST: 23 U/L (ref 10–35)
BUN: 13 mg/dL (ref 7–25)
CO2: 24 mmol/L (ref 20–31)
Calcium: 9.2 mg/dL (ref 8.6–10.2)
Chloride: 105 mmol/L (ref 98–110)
Creat: 0.79 mg/dL (ref 0.50–1.10)
GFR, EST NON AFRICAN AMERICAN: 88 mL/min (ref >=60)
GLUCOSE: 83 mg/dL (ref 65–99)
POTASSIUM: 4.4 mmol/L (ref 3.5–5.3)
SODIUM: 139 mmol/L (ref 135–146)
Total Bilirubin: 0.5 mg/dL (ref 0.2–1.2)
Total Protein: 7.2 g/dL (ref 6.1–8.1)

## 2015-05-05 LAB — LIPID PANEL
CHOL/HDL RATIO: 3.4 ratio (ref <=5.0)
Cholesterol: 194 mg/dL (ref 125–200)
HDL: 57 mg/dL (ref >=46)
LDL Cholesterol: 118 mg/dL (ref <130)
TRIGLYCERIDES: 93 mg/dL (ref <150)
VLDL: 19 mg/dL (ref <30)

## 2015-05-05 LAB — TSH: TSH: 1.689 u[IU]/mL (ref 0.350–4.500)

## 2015-07-06 ENCOUNTER — Ambulatory Visit: Payer: 59 | Admitting: Family Medicine

## 2015-07-06 ENCOUNTER — Encounter: Payer: 59 | Admitting: Family Medicine

## 2015-10-08 DIAGNOSIS — L814 Other melanin hyperpigmentation: Secondary | ICD-10-CM | POA: Diagnosis not present

## 2015-10-08 DIAGNOSIS — L708 Other acne: Secondary | ICD-10-CM | POA: Diagnosis not present

## 2016-03-10 DIAGNOSIS — Z1389 Encounter for screening for other disorder: Secondary | ICD-10-CM | POA: Diagnosis not present

## 2016-03-10 DIAGNOSIS — Z01419 Encounter for gynecological examination (general) (routine) without abnormal findings: Secondary | ICD-10-CM | POA: Diagnosis not present

## 2016-03-10 DIAGNOSIS — Z1151 Encounter for screening for human papillomavirus (HPV): Secondary | ICD-10-CM | POA: Diagnosis not present

## 2016-03-10 DIAGNOSIS — Z1211 Encounter for screening for malignant neoplasm of colon: Secondary | ICD-10-CM | POA: Diagnosis not present

## 2016-03-10 DIAGNOSIS — Z1231 Encounter for screening mammogram for malignant neoplasm of breast: Secondary | ICD-10-CM | POA: Diagnosis not present

## 2016-03-10 DIAGNOSIS — Z6826 Body mass index (BMI) 26.0-26.9, adult: Secondary | ICD-10-CM | POA: Diagnosis not present

## 2016-03-10 DIAGNOSIS — Z130 Encounter for screening for diseases of the blood and blood-forming organs and certain disorders involving the immune mechanism: Secondary | ICD-10-CM | POA: Diagnosis not present

## 2016-03-10 DIAGNOSIS — Z124 Encounter for screening for malignant neoplasm of cervix: Secondary | ICD-10-CM | POA: Diagnosis not present

## 2016-04-12 DIAGNOSIS — K219 Gastro-esophageal reflux disease without esophagitis: Secondary | ICD-10-CM | POA: Diagnosis not present

## 2016-04-12 DIAGNOSIS — Z1211 Encounter for screening for malignant neoplasm of colon: Secondary | ICD-10-CM | POA: Diagnosis not present

## 2016-04-12 DIAGNOSIS — K449 Diaphragmatic hernia without obstruction or gangrene: Secondary | ICD-10-CM | POA: Diagnosis not present

## 2016-05-16 MED FILL — GAVILYTE-G SOLUTION: 236 | 1 days supply | Qty: 4000 | Fill #0

## 2016-05-18 DIAGNOSIS — Z1211 Encounter for screening for malignant neoplasm of colon: Secondary | ICD-10-CM | POA: Diagnosis not present

## 2016-07-05 ENCOUNTER — Ambulatory Visit (INDEPENDENT_AMBULATORY_CARE_PROVIDER_SITE_OTHER): Payer: 59 | Admitting: Family Medicine

## 2016-07-05 VITALS — BP 128/80 | HR 74 | Temp 98.5°F | Resp 17 | Ht 62.0 in | Wt 141.0 lb

## 2016-07-05 DIAGNOSIS — R05 Cough: Secondary | ICD-10-CM

## 2016-07-05 DIAGNOSIS — Z13220 Encounter for screening for lipoid disorders: Secondary | ICD-10-CM | POA: Diagnosis not present

## 2016-07-05 DIAGNOSIS — M722 Plantar fascial fibromatosis: Secondary | ICD-10-CM | POA: Diagnosis not present

## 2016-07-05 DIAGNOSIS — R5383 Other fatigue: Secondary | ICD-10-CM | POA: Diagnosis not present

## 2016-07-05 DIAGNOSIS — R059 Cough, unspecified: Secondary | ICD-10-CM

## 2016-07-05 DIAGNOSIS — Z0000 Encounter for general adult medical examination without abnormal findings: Secondary | ICD-10-CM

## 2016-07-05 NOTE — Patient Instructions (Addendum)
Please check into your last tetanus - if more than 10 years, you are due for repeat.   I will check some tests for fatigue, but please return to discuss this further.  Please return fasting (8 hours) to have your blood work drawn within the next week if possible.  Your cough should improve within next 1 week.  Return to the clinic or go to the nearest emergency room if any of your symptoms worsen or new symptoms occur.  Your foot pain likely is due to plantar fasciitis. Try the stretch that I demonstrated in the office as well as other information below.  Please follow-up to discuss her foot pain and fatigue further the next month to 6 weeks. Sooner if worse.  Keeping You Healthy  Get These Tests  Blood Pressure- Have your blood pressure checked by your healthcare provider at least once a year.  Normal blood pressure is 120/80.  Weight- Have your body mass index (BMI) calculated to screen for obesity.  BMI is a measure of body fat based on height and weight.  You can calculate your own BMI at https://www.west-esparza.[email or web address]  Cholesterol- Have your cholesterol checked every year.  Diabetes- Have your blood sugar checked every year if you have high blood pressure, high cholesterol, a family history of diabetes or if you are overweight.  Pap Test - Have a pap test every 1 to 5 years if you have been sexually active.  If you are older than 65 and recent pap tests have been normal you may not need additional pap tests.  In addition, if you have had a hysterectomy  for benign disease additional pap tests are not necessary.  Mammogram-Yearly mammograms are essential for early detection of breast cancer  Screening for Colon Cancer- Colonoscopy starting at age 25. Screening may begin sooner depending on your family history and other health conditions.  Follow up colonoscopy as directed by your Gastroenterologist.  Screening for Osteoporosis- Screening begins at age 76 with bone density scanning, sooner  if you are at higher risk for developing Osteoporosis.  Get these medicines  Calcium with Vitamin D- Your body requires 1200-1500 mg of Calcium a day and [phone] IU of Vitamin D a day.  You can only absorb 500 mg of Calcium at a time therefore Calcium must be taken in 2 or 3 separate doses throughout the day.  Hormones- Hormone therapy has been associated with increased risk for certain cancers and heart disease.  Talk to your healthcare provider about if you need relief from menopausal symptoms.  Aspirin- Ask your healthcare provider about taking Aspirin to prevent Heart Disease and Stroke.  Get these Immuniztions  Flu shot- Every fall  Pneumonia shot- Once after the age of 78; if you are younger ask your healthcare provider if you need a pneumonia shot.  Tetanus- Every ten years.  Zostavax- Once after the age of 17 to prevent shingles.  Take these steps  Don't smoke- Your healthcare provider can help you quit. For tips on how to quit, ask your healthcare provider or go to www.smokefree.gov or call 1-800 QUIT-NOW.  Be physically active- Exercise 5 days a week for a minimum of 30 minutes.  If you are not already physically active, start slow and gradually work up to 30 minutes of moderate physical activity.  Try walking, dancing, bike riding, swimming, etc.  Eat a healthy diet- Eat a variety of healthy foods such as fruits, vegetables, whole grains, low fat milk, low fat cheeses, yogurt, lean  meats, chicken, fish, eggs, dried beans, tofu, etc.  For more information go to www.thenutritionsource.org  Dental visit- Brush and floss teeth twice daily; visit your dentist twice a year.  Eye exam- Visit your Optometrist or Ophthalmologist yearly.  Drink alcohol in moderation- Limit alcohol intake to one drink or less a day.  Never drink and drive.  Depression- Your emotional health is as important as your physical health.  If you're feeling down or losing interest in things you normally  enjoy, please talk to your healthcare provider.  Seat Belts- can save your life; always wear one  Smoke/Carbon Monoxide detectors- These detectors need to be installed on the appropriate level of your home.  Replace batteries at least once a year.  Violence- If anyone is threatening or hurting you, please tell your healthcare provider.  Living Will/ Health care power of attorney- Discuss with your healthcare provider and family. Plantar Fasciitis Plantar fasciitis is a painful foot condition that affects the heel. It occurs when the band of tissue that connects the toes to the heel bone (plantar fascia) becomes irritated. This can happen after exercising too much or doing other repetitive activities (overuse injury). The pain from plantar fasciitis can range from mild irritation to severe pain that makes it difficult for you to walk or move. The pain is usually worse in the morning or after you have been sitting or lying down for a while. CAUSES This condition may be caused by:  Standing for long periods of time.  Wearing shoes that do not fit.  Doing high-impact activities, including running, aerobics, and ballet.  Being overweight.  Having an abnormal way of walking (gait).  Having tight calf muscles.  Having high arches in your feet.  Starting a new athletic activity. SYMPTOMS The main symptom of this condition is heel pain. Other symptoms include:  Pain that gets worse after activity or exercise.  Pain that is worse in the morning or after resting.  Pain that goes away after you walk for a few minutes. DIAGNOSIS This condition may be diagnosed based on your signs and symptoms. Your health care provider will also do a physical exam to check for:  A tender area on the bottom of your foot.  A high arch in your foot.  Pain when you move your foot.  Difficulty moving your foot. You may also need to have imaging studies to confirm the diagnosis. These can  include:  X-rays.  Ultrasound.  MRI. TREATMENT  Treatment for plantar fasciitis depends on the severity of the condition. Your treatment may include:  Rest, ice, and over-the-counter pain medicines to manage your pain.  Exercises to stretch your calves and your plantar fascia.  A splint that holds your foot in a stretched, upward position while you sleep (night splint).  Physical therapy to relieve symptoms and prevent problems in the future.  Cortisone injections to relieve severe pain.  Extracorporeal shock wave therapy (ESWT) to stimulate damaged plantar fascia with electrical impulses. It is often used as a last resort before surgery.  Surgery, if other treatments have not worked after 12 months. HOME CARE INSTRUCTIONS  Take medicines only as directed by your health care provider.  Avoid activities that cause pain.  Roll the bottom of your foot over a bag of ice or a bottle of cold water. Do this for 20 minutes, 3-4 times a day.  Perform simple stretches as directed by your health care provider.  Try wearing athletic shoes with air-sole or gel-sole  cushions or soft shoe inserts.  Wear a night splint while sleeping, if directed by your health care provider.  Keep all follow-up appointments with your health care provider. PREVENTION   Do not perform exercises or activities that cause heel pain.  Consider finding low-impact activities if you continue to have problems.  Lose weight if you need to. The best way to prevent plantar fasciitis is to avoid the activities that aggravate your plantar fascia. SEEK MEDICAL CARE IF:  Your symptoms do not go away after treatment with home care measures.  Your pain gets worse.  Your pain affects your ability to move or do your daily activities. This information is not intended to replace advice given to you by your health care provider. Make sure you discuss any questions you have with your health care provider. Document  Released: 04/26/2001 Document Revised: 11/23/2015 Document Reviewed: 06/11/2014 Elsevier Interactive Patient Education  2017 [employer]  Upper Respiratory Infection, Adult Most upper respiratory infections (URIs) are a viral infection of the air passages leading to the lungs. A URI affects the nose, throat, and upper air passages. The most common type of URI is nasopharyngitis and is typically referred to as "the common cold." URIs run their course and usually go away on their own. Most of the time, a URI does not require medical attention, but sometimes a bacterial infection in the upper airways can follow a viral infection. This is called a secondary infection. Sinus and middle ear infections are common types of secondary upper respiratory infections. Bacterial pneumonia can also complicate a URI. A URI can worsen asthma and chronic obstructive pulmonary disease (COPD). Sometimes, these complications can require emergency medical care and may be life threatening. What are the causes? Almost all URIs are caused by viruses. A virus is a type of germ and can spread from one person to another. What increases the risk? You may be at risk for a URI if:  You smoke.  You have chronic heart or lung disease.  You have a weakened defense (immune) system.  You are very young or very old.  You have nasal allergies or asthma.  You work in crowded or poorly ventilated areas.  You work in health care facilities or schools. What are the signs or symptoms? Symptoms typically develop 2-3 days after you come in contact with a cold virus. Most viral URIs last 7-10 days. However, viral URIs from the influenza virus (flu virus) can last 14-18 days and are typically more severe. Symptoms may include:  Runny or stuffy (congested) nose.  Sneezing.  Cough.  Sore throat.  Headache.  Fatigue.  Fever.  Loss of appetite.  Pain in your forehead, behind your eyes, and over your cheekbones (sinus  pain).  Muscle aches. How is this diagnosed? Your health care provider may diagnose a URI by:  Physical exam.  Tests to check that your symptoms are not due to another condition such as:  Strep throat.  Sinusitis.  Pneumonia.  Asthma. How is this treated? A URI goes away on its own with time. It cannot be cured with medicines, but medicines may be prescribed or recommended to relieve symptoms. Medicines may help:  Reduce your fever.  Reduce your cough.  Relieve nasal congestion. Follow these instructions at home:  Take medicines only as directed by your health care provider.  Gargle warm saltwater or take cough drops to comfort your throat as directed by your health care provider.  Use a warm mist humidifier or inhale  steam from a shower to increase air moisture. This may make it easier to breathe.  Drink enough fluid to keep your urine clear or pale yellow.  Eat soups and other clear broths and maintain good nutrition.  Rest as needed.  Return to work when your temperature has returned to normal or as your health care provider advises. You may need to stay home longer to avoid infecting others. You can also use a face mask and careful hand washing to prevent spread of the virus.  Increase the usage of your inhaler if you have asthma.  Do not use any tobacco products, including cigarettes, chewing tobacco, or electronic cigarettes. If you need help quitting, ask your health care provider. How is this prevented? The best way to protect yourself from getting a cold is to practice good hygiene.  Avoid oral or hand contact with people with cold symptoms.  Wash your hands often if contact occurs. There is no clear evidence that vitamin C, vitamin E, echinacea, or exercise reduces the chance of developing a cold. However, it is always recommended to get plenty of rest, exercise, and practice good nutrition. Contact a health care provider if:  You are getting worse  rather than better.  Your symptoms are not controlled by medicine.  You have chills.  You have worsening shortness of breath.  You have brown or red mucus.  You have yellow or brown nasal discharge.  You have pain in your face, especially when you bend forward.  You have a fever.  You have swollen neck glands.  You have pain while swallowing.  You have white areas in the back of your throat. Get help right away if:  You have severe or persistent:  Headache.  Ear pain.  Sinus pain.  Chest pain.  You have chronic lung disease and any of the following:  Wheezing.  Prolonged cough.  Coughing up blood.  A change in your usual mucus.  You have a stiff neck.  You have changes in your:  Vision.  Hearing.  Thinking.  Mood. This information is not intended to replace advice given to you by your health care provider. Make sure you discuss any questions you have with your health care provider. Document Released: 01/25/2001 Document Revised: 04/03/2016 Document Reviewed: 11/06/2013 Elsevier Interactive Patient Education  2017 [employer]  Cough, Adult Coughing is a reflex that clears your throat and your airways. Coughing helps to heal and protect your lungs. It is normal to cough occasionally, but a cough that happens with other symptoms or lasts a long time may be a sign of a condition that needs treatment. A cough may last only 2-3 weeks (acute), or it may last longer than 8 weeks (chronic). What are the causes? Coughing is commonly caused by:  Breathing in substances that irritate your lungs.  A viral or bacterial respiratory infection.  Allergies.  Asthma.  Postnasal drip.  Smoking.  Acid backing up from the stomach into the esophagus (gastroesophageal reflux).  Certain medicines.  Chronic lung problems, including COPD (or rarely, lung cancer).  Other medical conditions such as heart failure. Follow these instructions at home: Pay  attention to any changes in your symptoms. Take these actions to help with your discomfort:  Take medicines only as told by your health care provider.  If you were prescribed an antibiotic medicine, take it as told by your health care provider. Do not stop taking the antibiotic even if you start to feel better.  Talk with  your health care provider before you take a cough suppressant medicine.  Drink enough fluid to keep your urine clear or pale yellow.  If the air is dry, use a cold steam vaporizer or humidifier in your bedroom or your home to help loosen secretions.  Avoid anything that causes you to cough at work or at home.  If your cough is worse at night, try sleeping in a semi-upright position.  Avoid cigarette smoke. If you smoke, quit smoking. If you need help quitting, ask your health care provider.  Avoid caffeine.  Avoid alcohol.  Rest as needed. Contact a health care provider if:  You have new symptoms.  You cough up pus.  Your cough does not get better after 2-3 weeks, or your cough gets worse.  You cannot control your cough with suppressant medicines and you are losing sleep.  You develop pain that is getting worse or pain that is not controlled with pain medicines.  You have a fever.  You have unexplained weight loss.  You have night sweats. Get help right away if:  You cough up blood.  You have difficulty breathing.  Your heartbeat is very fast. This information is not intended to replace advice given to you by your health care provider. Make sure you discuss any questions you have with your health care provider. Document Released: 01/28/2011 Document Revised: 01/07/2016 Document Reviewed: 10/08/2014 Elsevier Interactive Patient Education  2017 [employer]   IF you received an x-ray today, you will [facility]. Please contact [facility] at [phone] with questions or concerns regarding your invoice.    IF you received labwork today, you will receive an invoice from [facility]. Please contact [facility] at [phone] with questions or concerns regarding your invoice.   Our billing staff will not be able to assist you with questions regarding bills from these companies.  You will be contacted with the lab results as soon as they are available. The fastest way to get your results is to activate your My Chart account. Instructions are located on the last page of this paperwork. If you have not heard from us regarding the results in 2 weeks, please contact this office.

## 2016-07-05 NOTE — Progress Notes (Signed)
By signing my name below, I, Mesha Guinyard, attest that this documentation has been prepared under the direction and in the presence of Meredith StaggersJeffrey Haislee Corso, MD.  Electronically Signed: Arvilla MarketMesha Guinyard, Medical Scribe. 07/05/16. 8:26 AM.  Subjective:    Patient ID: Alyssa Moody, female    DOB: 1966/05/02, 50 y.o.   MRN: [id number]  HPI Chief Complaint  Patient presents with  . Annual Exam    with no pap     HPI Comments: Alyssa SheffieldMarly C Moody is a 50 y.o. female who presents to the [facility] for annual physical exam. Last physical was with me in 2016. CMP, lipid panel, CBC, TSH all nl last year. Pt is not fasting.  Cough: Onset the past week and it has been improving. It occurs late at night, and early in the morning. Reports associated symptoms of sore throat, and some rhinorrhea. Taking honey and lemon drinks for her symptoms. Denies fever, SOB, and hoarse voice  Fatigue: Pt reports fatigue that's been going on for a couple of months. Reports it's not similar to last year since she's working during the day, and no longer working nights. Denies chest pains, and palpitations with her fatigue. FHx: Parents have thyroid issues.   Heel: Previously had pain in the mid foot, but now it's in her heel and has occurred for the last 4 months. Pain occasionally occurs early in the morning when she takes her first step. when she She has been using a shoe insert for little relief to her pain.  Ear Pain: Reports occasional ear pain. Used some ear drops to clean her ear. Denies using q-tips for cleaning her ears.  Cancer Screening: Cervical CA: Pap testing by OB/GYN, nl 2 years ago. Had a pap smear this year with nl results. Breast CA: Mammogram this year with nl results. Colon CA: Colonoscopy by [facility] in Sept. With nl results; repeat in 10 years.  Immunizations: Received her flu shot this year. Suspects she's due for her tetanus shot.  There is no immunization history on file  for this patient.  Vision: Saw her ophthalmologist this year and her Rx has changed. Uses OTC reading glasses.  Visual Acuity Screening   Right eye Left eye Both eyes  Without correction: 20/20 20/20 20/20   With correction:      Dentist: Is followed by a dentist.  Exercise: She doesn't exercise as much as she would like to.   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 Denies feeling sad. [facility] screen [facility] 07/05/2016 05/04/2015  Decreased Interest 0 0  Down, Depressed, Hopeless 0 0  PHQ - 2 Score 0 0    There are no active problems to display for this patient.  Past Medical History:  Diagnosis Date  . Hiatal hernia    Past Surgical History:  Procedure Laterality Date  . APPENDECTOMY    . TUBAL LIGATION     No Known Allergies Prior to Admission medications   Not on File   Social History   Social History  . Marital status: Single    Spouse name: N/A  . Number of children: N/A  . Years of education: N/A   Occupational History  . Not on file.   Social History Main Topics  . Smoking status: Never Smoker  . Smokeless tobacco: Not on file  . Alcohol use No  . Drug use: No  .  Sexual activity: No   Other Topics Concern  . Not on file   Social History Narrative  . No narrative on file   Review of Systems  Constitutional: Positive for fatigue.  HENT: Positive for ear pain, rhinorrhea and sore throat.   Respiratory: Positive for cough.   13 point ROS positive for the above only.  Objective:  Physical Exam  Constitutional: She is oriented to person, place, and time. She appears well-developed and well-nourished.  HENT:  Head: Normocephalic and atraumatic.  Right Ear: External ear normal.  Left Ear: External ear normal.  Mouth/Throat: Oropharynx is clear and moist.  Eyes: Conjunctivae are normal. Pupils are equal, round, and reactive to light.    Neck: Normal range of motion. Neck supple. No thyromegaly present.  Cardiovascular: Normal rate, regular rhythm, normal heart sounds and intact distal pulses.   No murmur heard. Pulmonary/Chest: Effort normal and breath sounds normal. No respiratory distress. She has no wheezes.  Abdominal: Soft. Bowel sounds are normal. There is no tenderness.  Musculoskeletal: Normal range of motion. She exhibits no edema or tenderness.  Tender at proximal of plantar fascia to base of heel Neg lateral squeeze at right heel  Lymphadenopathy:    She has no cervical adenopathy.  Neurological: She is alert and oriented to person, place, and time.  Skin: Skin is warm and dry. No rash noted.  Psychiatric: She has a normal mood and affect. Her behavior is normal. Thought content normal.  Vitals reviewed.  BP 128/80 (BP Location: Right Arm, Patient Position: Sitting, Cuff Size: Normal)   Pulse 74   Temp 98.5 F (36.9 C) (Oral)   Resp 17   Ht 5\' 2"  (1.575 m)   Wt 141 lb (64 kg)   SpO2 98%   BMI 25.79 kg/m  Assessment & Plan:   Alyssa Moody is a 50 y.o. female Annual physical exam  - -anticipatory guidance as below in AVS, screening labs above. Health maintenance items as above in HPI discussed/recommended as applicable.   Other fatigue - Plan: COMPLETE METABOLIC PANEL WITH GFR, TSH, T4, Free, CBC with Differential/Platelet  - return for fasting labs. Reassuring exam and vital signs. Less likely work shift related now.   -return to discss further after labs as above. RTC/ER precautions.   Cough  - viral URI possible. Sx care, and rtc precautions given.   Plantar fasciitis  - likley cause of heel pain. Stretching demonstrated and handout given. Recheck if persistent or option to refer to podiatry.   Screening for hyperlipidemia - Plan: COMPLETE METABOLIC PANEL WITH GFR, Lipid panel  - return for fasting labs.   No orders of the defined types were placed in this encounter.  Patient Instructions    Please check into your last tetanus - if more than 10 years, you are due for repeat.   I will check some tests for fatigue, but please return to discuss this further.  Please return fasting (8 hours) to have your blood work drawn within the next week if possible.  Your cough should improve within next 1 week.  Return to the clinic or go to the nearest emergency room if any of your symptoms worsen or new symptoms occur.  Your foot pain likely is due to plantar fasciitis. Try the stretch that I demonstrated in the office as well as other information below.  Please follow-up to discuss her foot pain and fatigue further the next month to 6 weeks. Sooner if worse.  Keeping You Healthy  Get These Tests  Blood Pressure- Have your blood pressure checked by your healthcare provider at least once a year.  Normal blood pressure is 120/80.  Weight- Have your body mass index (BMI) calculated to screen for obesity.  BMI is a measure of body fat based on height and weight.  You can calculate your own BMI at https://www.west-esparza.com/  Cholesterol- Have your cholesterol checked every year.  Diabetes- Have your blood sugar checked every year if you have high blood pressure, high cholesterol, a family history of diabetes or if you are overweight.  Pap Test - Have a pap test every 1 to 5 years if you have been sexually active.  If you are older than 65 and recent pap tests have been normal you may not need additional pap tests.  In addition, if you have had a hysterectomy  for benign disease additional pap tests are not necessary.  Mammogram-Yearly mammograms are essential for early detection of breast cancer  Screening for Colon Cancer- Colonoscopy starting at age 70. Screening may begin sooner depending on your family history and other health conditions.  Follow up colonoscopy as directed by your Gastroenterologist.  Screening for Osteoporosis- Screening begins at age 73 with bone density scanning,  sooner if you are at higher risk for developing Osteoporosis.  Get these medicines  Calcium with Vitamin D- Your body requires 1200-1500 mg of Calcium a day and [phone] IU of Vitamin D a day.  You can only absorb 500 mg of Calcium at a time therefore Calcium must be taken in 2 or 3 separate doses throughout the day.  Hormones- Hormone therapy has been associated with increased risk for certain cancers and heart disease.  Talk to your healthcare provider about if you need relief from menopausal symptoms.  Aspirin- Ask your healthcare provider about taking Aspirin to prevent Heart Disease and Stroke.  Get these Immuniztions  Flu shot- Every fall  Pneumonia shot- Once after the age of 35; if you are younger ask your healthcare provider if you need a pneumonia shot.  Tetanus- Every ten years.  Zostavax- Once after the age of 10 to prevent shingles.  Take these steps  Don't smoke- Your healthcare provider can help you quit. For tips on how to quit, ask your healthcare provider or go to www.smokefree.gov or call 1-800 QUIT-NOW.  Be physically active- Exercise 5 days a week for a minimum of 30 minutes.  If you are not already physically active, start slow and gradually work up to 30 minutes of moderate physical activity.  Try walking, dancing, bike riding, swimming, etc.  Eat a healthy diet- Eat a variety of healthy foods such as fruits, vegetables, whole grains, low fat milk, low fat cheeses, yogurt, lean meats, chicken, fish, eggs, dried beans, tofu, etc.  For more information go to www.thenutritionsource.org  Dental visit- Brush and floss teeth twice daily; visit your dentist twice a year.  Eye exam- Visit your Optometrist or Ophthalmologist yearly.  Drink alcohol in moderation- Limit alcohol intake to one drink or less a day.  Never drink and drive.  Depression- Your emotional health is as important as your physical health.  If you're feeling down or losing interest in things you  normally enjoy, please talk to your healthcare provider.  Seat Belts- can save your life; always wear one  Smoke/Carbon Monoxide detectors- These detectors need to be installed on the appropriate level of your home.  Replace batteries at least once a year.  Violence- If anyone is threatening  or hurting you, please tell your healthcare provider.  Living Will/ Health care power of attorney- Discuss with your healthcare provider and family. Plantar Fasciitis Plantar fasciitis is a painful foot condition that affects the heel. It occurs when the band of tissue that connects the toes to the heel bone (plantar fascia) becomes irritated. This can happen after exercising too much or doing other repetitive activities (overuse injury). The pain from plantar fasciitis can range from mild irritation to severe pain that makes it difficult for you to walk or move. The pain is usually worse in the morning or after you have been sitting or lying down for a while. CAUSES This condition may be caused by:  Standing for long periods of time.  Wearing shoes that do not fit.  Doing high-impact activities, including running, aerobics, and ballet.  Being overweight.  Having an abnormal way of walking (gait).  Having tight calf muscles.  Having high arches in your feet.  Starting a new athletic activity. SYMPTOMS The main symptom of this condition is heel pain. Other symptoms include:  Pain that gets worse after activity or exercise.  Pain that is worse in the morning or after resting.  Pain that goes away after you walk for a few minutes. DIAGNOSIS This condition may be diagnosed based on your signs and symptoms. Your health care provider will also do a physical exam to check for:  A tender area on the bottom of your foot.  A high arch in your foot.  Pain when you move your foot.  Difficulty moving your foot. You may also need to have imaging studies to confirm the diagnosis. These can  include:  X-rays.  Ultrasound.  MRI. TREATMENT  Treatment for plantar fasciitis depends on the severity of the condition. Your treatment may include:  Rest, ice, and over-the-counter pain medicines to manage your pain.  Exercises to stretch your calves and your plantar fascia.  A splint that holds your foot in a stretched, upward position while you sleep (night splint).  Physical therapy to relieve symptoms and prevent problems in the future.  Cortisone injections to relieve severe pain.  Extracorporeal shock wave therapy (ESWT) to stimulate damaged plantar fascia with electrical impulses. It is often used as a last resort before surgery.  Surgery, if other treatments have not worked after 12 months. HOME CARE INSTRUCTIONS  Take medicines only as directed by your health care provider.  Avoid activities that cause pain.  Roll the bottom of your foot over a bag of ice or a bottle of cold water. Do this for 20 minutes, 3-4 times a day.  Perform simple stretches as directed by your health care provider.  Try wearing athletic shoes with air-sole or gel-sole cushions or soft shoe inserts.  Wear a night splint while sleeping, if directed by your health care provider.  Keep all follow-up appointments with your health care provider. PREVENTION   Do not perform exercises or activities that cause heel pain.  Consider finding low-impact activities if you continue to have problems.  Lose weight if you need to. The best way to prevent plantar fasciitis is to avoid the activities that aggravate your plantar fascia. SEEK MEDICAL CARE IF:  Your symptoms do not go away after treatment with home care measures.  Your pain gets worse.  Your pain affects your ability to move or do your daily activities. This information is not intended to replace advice given to you by your health care provider. Make sure you discuss  any questions you have with your health care provider. Document  Released: 04/26/2001 Document Revised: 11/23/2015 Document Reviewed: 06/11/2014 Elsevier Interactive Patient Education  2017 Elsevier Inc.  Upper Respiratory Infection, Adult Most upper respiratory infections (URIs) are a viral infection of the air passages leading to the lungs. A URI affects the nose, throat, and upper air passages. The most common type of URI is nasopharyngitis and is typically referred to as "the common cold." URIs run their course and usually go away on their own. Most of the time, a URI does not require medical attention, but sometimes a bacterial infection in the upper airways can follow a viral infection. This is called a secondary infection. Sinus and middle ear infections are common types of secondary upper respiratory infections. Bacterial pneumonia can also complicate a URI. A URI can worsen asthma and chronic obstructive pulmonary disease (COPD). Sometimes, these complications can require emergency medical care and may be life threatening. What are the causes? Almost all URIs are caused by viruses. A virus is a type of germ and can spread from one person to another. What increases the risk? You may be at risk for a URI if:  You smoke.  You have chronic heart or lung disease.  You have a weakened defense (immune) system.  You are very young or very old.  You have nasal allergies or asthma.  You work in crowded or poorly ventilated areas.  You work in health care facilities or schools. What are the signs or symptoms? Symptoms typically develop 2-3 days after you come in contact with a cold virus. Most viral URIs last 7-10 days. However, viral URIs from the influenza virus (flu virus) can last 14-18 days and are typically more severe. Symptoms may include:  Runny or stuffy (congested) nose.  Sneezing.  Cough.  Sore throat.  Headache.  Fatigue.  Fever.  Loss of appetite.  Pain in your forehead, behind your eyes, and over your cheekbones (sinus  pain).  Muscle aches. How is this diagnosed? Your health care provider may diagnose a URI by:  Physical exam.  Tests to check that your symptoms are not due to another condition such as:  Strep throat.  Sinusitis.  Pneumonia.  Asthma. How is this treated? A URI goes away on its own with time. It cannot be cured with medicines, but medicines may be prescribed or recommended to relieve symptoms. Medicines may help:  Reduce your fever.  Reduce your cough.  Relieve nasal congestion. Follow these instructions at home:  Take medicines only as directed by your health care provider.  Gargle warm saltwater or take cough drops to comfort your throat as directed by your health care provider.  Use a warm mist humidifier or inhale steam from a shower to increase air moisture. This may make it easier to breathe.  Drink enough fluid to keep your urine clear or pale yellow.  Eat soups and other clear broths and maintain good nutrition.  Rest as needed.  Return to work when your temperature has returned to normal or as your health care provider advises. You may need to stay home longer to avoid infecting others. You can also use a face mask and careful hand washing to prevent spread of the virus.  Increase the usage of your inhaler if you have asthma.  Do not use any tobacco products, including cigarettes, chewing tobacco, or electronic cigarettes. If you need help quitting, ask your health care provider. How is this prevented? The best way to protect  yourself from getting a cold is to practice good hygiene.  Avoid oral or hand contact with people with cold symptoms.  Wash your hands often if contact occurs. There is no clear evidence that vitamin C, vitamin E, echinacea, or exercise reduces the chance of developing a cold. However, it is always recommended to get plenty of rest, exercise, and practice good nutrition. Contact a health care provider if:  You are getting worse  rather than better.  Your symptoms are not controlled by medicine.  You have chills.  You have worsening shortness of breath.  You have brown or red mucus.  You have yellow or brown nasal discharge.  You have pain in your face, especially when you bend forward.  You have a fever.  You have swollen neck glands.  You have pain while swallowing.  You have white areas in the back of your throat. Get help right away if:  You have severe or persistent:  Headache.  Ear pain.  Sinus pain.  Chest pain.  You have chronic lung disease and any of the following:  Wheezing.  Prolonged cough.  Coughing up blood.  A change in your usual mucus.  You have a stiff neck.  You have changes in your:  Vision.  Hearing.  Thinking.  Mood. This information is not intended to replace advice given to you by your health care provider. Make sure you discuss any questions you have with your health care provider. Document Released: 01/25/2001 Document Revised: 04/03/2016 Document Reviewed: 11/06/2013 Elsevier Interactive Patient Education  2017 Elsevier Inc.  Cough, Adult Coughing is a reflex that clears your throat and your airways. Coughing helps to heal and protect your lungs. It is normal to cough occasionally, but a cough that happens with other symptoms or lasts a long time may be a sign of a condition that needs treatment. A cough may last only 2-3 weeks (acute), or it may last longer than 8 weeks (chronic). What are the causes? Coughing is commonly caused by:  Breathing in substances that irritate your lungs.  A viral or bacterial respiratory infection.  Allergies.  Asthma.  Postnasal drip.  Smoking.  Acid backing up from the stomach into the esophagus (gastroesophageal reflux).  Certain medicines.  Chronic lung problems, including COPD (or rarely, lung cancer).  Other medical conditions such as heart failure. Follow these instructions at home: Pay  attention to any changes in your symptoms. Take these actions to help with your discomfort:  Take medicines only as told by your health care provider.  If you were prescribed an antibiotic medicine, take it as told by your health care provider. Do not stop taking the antibiotic even if you start to feel better.  Talk with your health care provider before you take a cough suppressant medicine.  Drink enough fluid to keep your urine clear or pale yellow.  If the air is dry, use a cold steam vaporizer or humidifier in your bedroom or your home to help loosen secretions.  Avoid anything that causes you to cough at work or at home.  If your cough is worse at night, try sleeping in a semi-upright position.  Avoid cigarette smoke. If you smoke, quit smoking. If you need help quitting, ask your health care provider.  Avoid caffeine.  Avoid alcohol.  Rest as needed. Contact a health care provider if:  You have new symptoms.  You cough up pus.  Your cough does not get better after 2-3 weeks, or your cough gets  worse.  You cannot control your cough with suppressant medicines and you are losing sleep.  You develop pain that is getting worse or pain that is not controlled with pain medicines.  You have a fever.  You have unexplained weight loss.  You have night sweats. Get help right away if:  You cough up blood.  You have difficulty breathing.  Your heartbeat is very fast. This information is not intended to replace advice given to you by your health care provider. Make sure you discuss any questions you have with your health care provider. Document Released: 01/28/2011 Document Revised: 01/07/2016 Document Reviewed: 10/08/2014 Elsevier Interactive Patient Education  2017 [employer].   IF you received an x-ray today, you will receive an invoice from [facility]. Please contact [facility] at [phone] with questions or concerns regarding your invoice.     IF you received labwork today, you will receive an invoice from [employer]. Please contact [facility] at [phone] with questions or concerns regarding your invoice.   Our billing staff will not be able to assist you with questions regarding bills from these companies.  You will be contacted with the lab results as soon as they are available. The fastest way to get your results is to activate your My Chart account. Instructions are located on the last page of this paperwork. If you have not heard from [place] regarding the results in 2 weeks, please contact this office.        I personally performed the services described in this documentation, which was scribed in my presence. The recorded information has been reviewed and considered, and addended by me as needed.   Signed,   [name], MD [facility].  07/06/16 10:52 PM

## 2016-07-11 ENCOUNTER — Other Ambulatory Visit (INDEPENDENT_AMBULATORY_CARE_PROVIDER_SITE_OTHER): Payer: 59

## 2016-07-11 DIAGNOSIS — Z13220 Encounter for screening for lipoid disorders: Secondary | ICD-10-CM

## 2016-07-11 DIAGNOSIS — R5383 Other fatigue: Secondary | ICD-10-CM

## 2016-07-11 LAB — COMPLETE METABOLIC PANEL WITH GFR
ALT: 14 U/L (ref 6–29)
AST: 22 U/L (ref 10–35)
Albumin: 4.3 g/dL (ref 3.6–5.1)
Alkaline Phosphatase: 64 U/L (ref 33–130)
BILIRUBIN TOTAL: 0.4 mg/dL (ref 0.2–1.2)
BUN: 11 mg/dL (ref 7–25)
CO2: 26 mmol/L (ref 20–31)
CREATININE: 0.78 mg/dL (ref 0.50–1.05)
Calcium: 9 mg/dL (ref 8.6–10.4)
Chloride: 104 mmol/L (ref 98–110)
GFR, Est African American: >89 mL/min (ref >=60)
GFR, Est Non African American: 89 mL/min (ref >=60)
GLUCOSE: 84 mg/dL (ref 65–99)
Potassium: 4.1 mmol/L (ref 3.5–5.3)
SODIUM: 137 mmol/L (ref 135–146)
TOTAL PROTEIN: 7 g/dL (ref 6.1–8.1)

## 2016-07-11 LAB — CBC WITH DIFFERENTIAL/PLATELET
BASOS PCT: 1 %
Basophils Absolute: 51 cells/uL (ref 0–200)
EOS ABS: 51 {cells}/uL (ref 15–500)
Eosinophils Relative: 1 %
HCT: 42.2 % (ref 35.0–45.0)
Hemoglobin: 14 g/dL (ref 11.7–15.5)
LYMPHS PCT: 24 %
Lymphs Abs: 1224 cells/uL (ref 850–3900)
MCH: 30.2 pg (ref 27.0–33.0)
MCHC: 33.2 g/dL (ref 32.0–36.0)
MCV: 91.1 fL (ref 80.0–100.0)
MONOS PCT: 10 %
MPV: 10.2 fL (ref 7.5–12.5)
Monocytes Absolute: 510 cells/uL (ref 200–950)
Neutro Abs: 3264 cells/uL (ref 1500–7800)
Neutrophils Relative %: 64 %
PLATELETS: 288 10*3/uL (ref 140–400)
RBC: 4.63 MIL/uL (ref 3.80–5.10)
RDW: 13.5 % (ref 11.0–15.0)
WBC: 5.1 10*3/uL (ref 3.8–10.8)

## 2016-07-11 LAB — LIPID PANEL
Cholesterol: 193 mg/dL (ref <200)
HDL: 45 mg/dL — ABNORMAL LOW (ref >50)
LDL CALC: 116 mg/dL — AB (ref <100)
Total CHOL/HDL Ratio: 4.3 Ratio (ref <5.0)
Triglycerides: 162 mg/dL — ABNORMAL HIGH (ref <150)
VLDL: 32 mg/dL — ABNORMAL HIGH (ref <30)

## 2016-07-11 LAB — TSH: TSH: 2.38 m[IU]/L

## 2016-07-11 LAB — T4, FREE: Free T4: 1 ng/dL (ref 0.8–1.8)

## 2018-03-23 ENCOUNTER — Other Ambulatory Visit: Payer: Self-pay | Admitting: Obstetrics and Gynecology

## 2018-03-23 DIAGNOSIS — R928 Other abnormal and inconclusive findings on diagnostic imaging of breast: Secondary | ICD-10-CM

## 2018-03-29 ENCOUNTER — Other Ambulatory Visit: Payer: Self-pay

## 2018-04-02 ENCOUNTER — Other Ambulatory Visit: Payer: Self-pay

## 2018-04-02 ENCOUNTER — Ambulatory Visit
Admission: RE | Admit: 2018-04-02 | Discharge: 2018-04-02 | Disposition: A | Discharge status: Home / Self Care | Payer: Commercial Managed Care - PPO | Source: Ambulatory Visit | Attending: Obstetrics and Gynecology | Admitting: Obstetrics and Gynecology

## 2018-04-02 DIAGNOSIS — R928 Other abnormal and inconclusive findings on diagnostic imaging of breast: Secondary | ICD-10-CM

## 2019-09-03 IMAGING — MG DIGITAL DIAGNOSTIC BILATERAL MAMMOGRAM WITH TOMO AND CAD
6 of 12 series · 6 of 36 positions shown · non-contrast
Comparison: Previous exam(s).

CLINICAL DATA: Possible mass in the central right breast, 2
possible masses in the outer left breast and one possible mass in
the inner left breast a recent screening mammogram.

EXAM:
DIGITAL DIAGNOSTIC BILATERAL MAMMOGRAM WITH CAD AND TOMO
ULTRASOUND BILATERAL BREAST

[L CC synth-2D (1 of 2)]
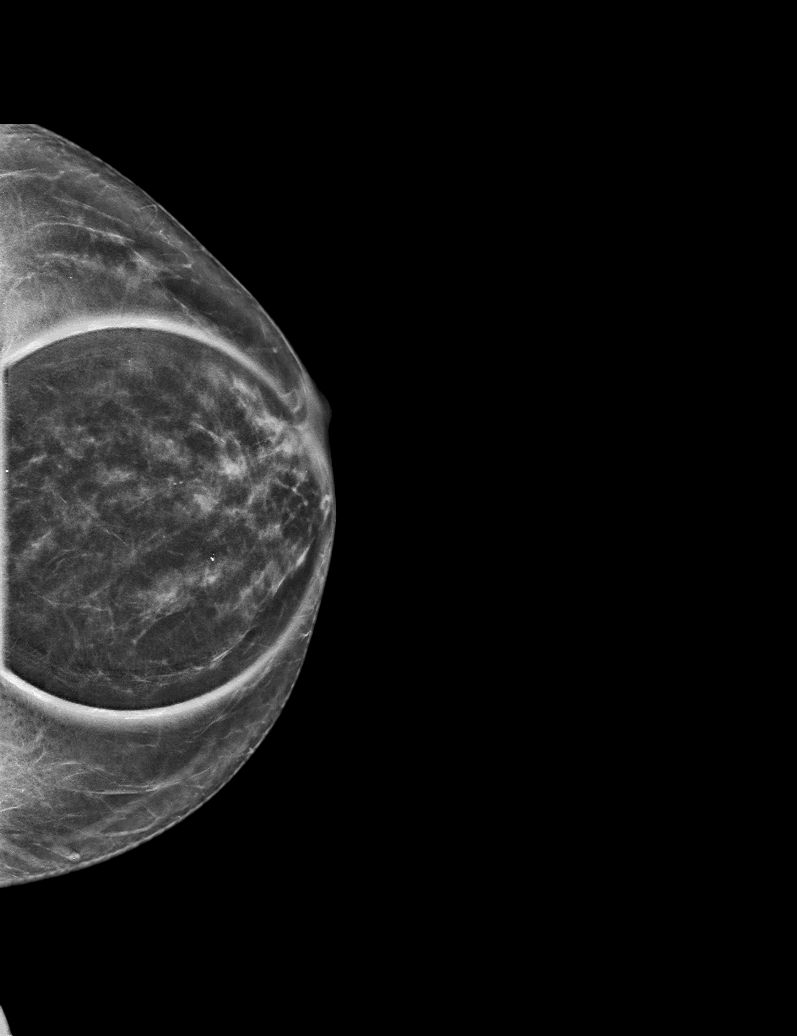

[R CC synth-2D]
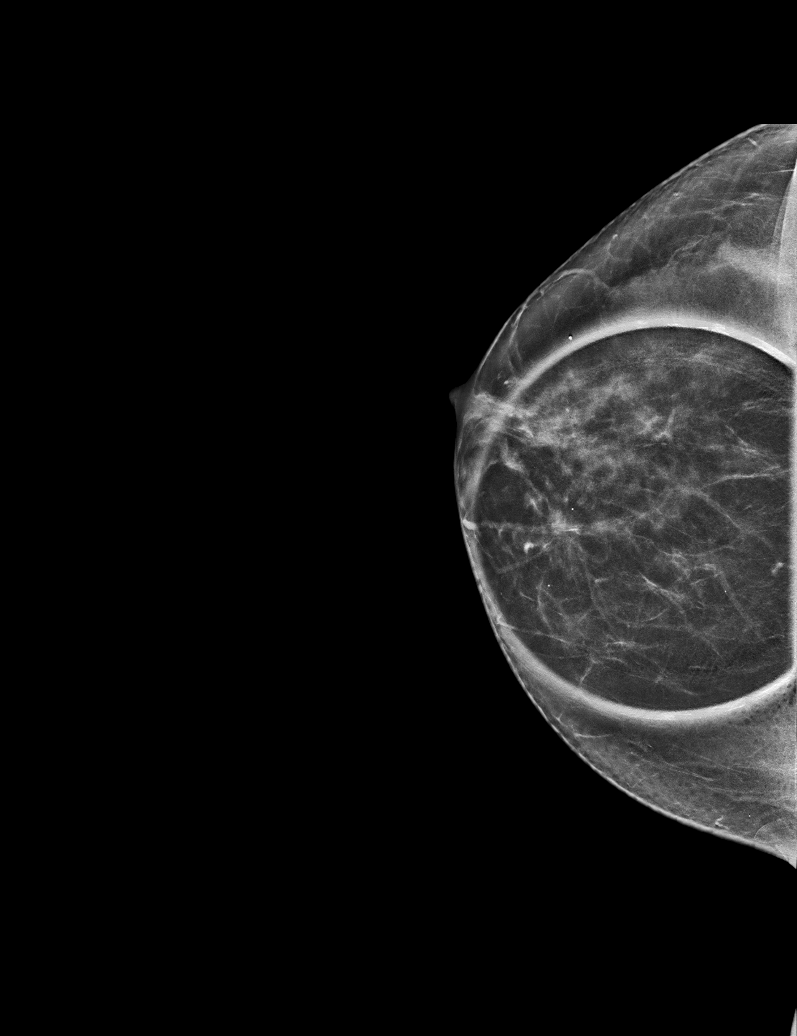

[L MLO synth-2D (1 of 2)]
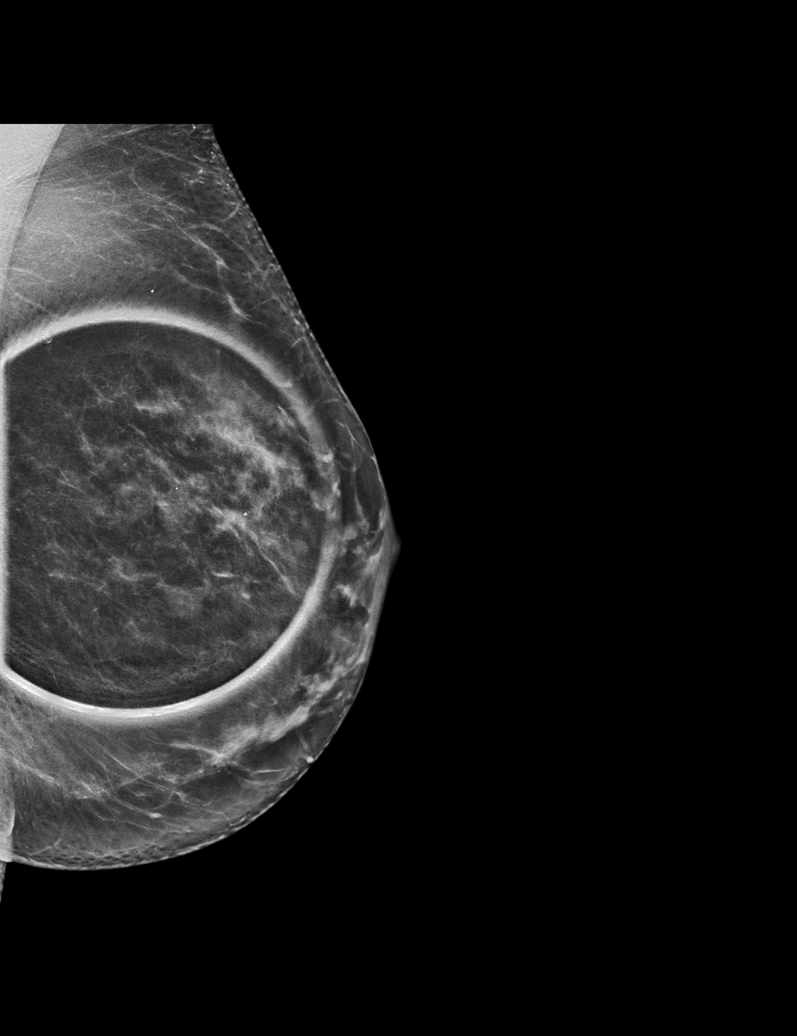

[L MLO synth-2D (2 of 2)]
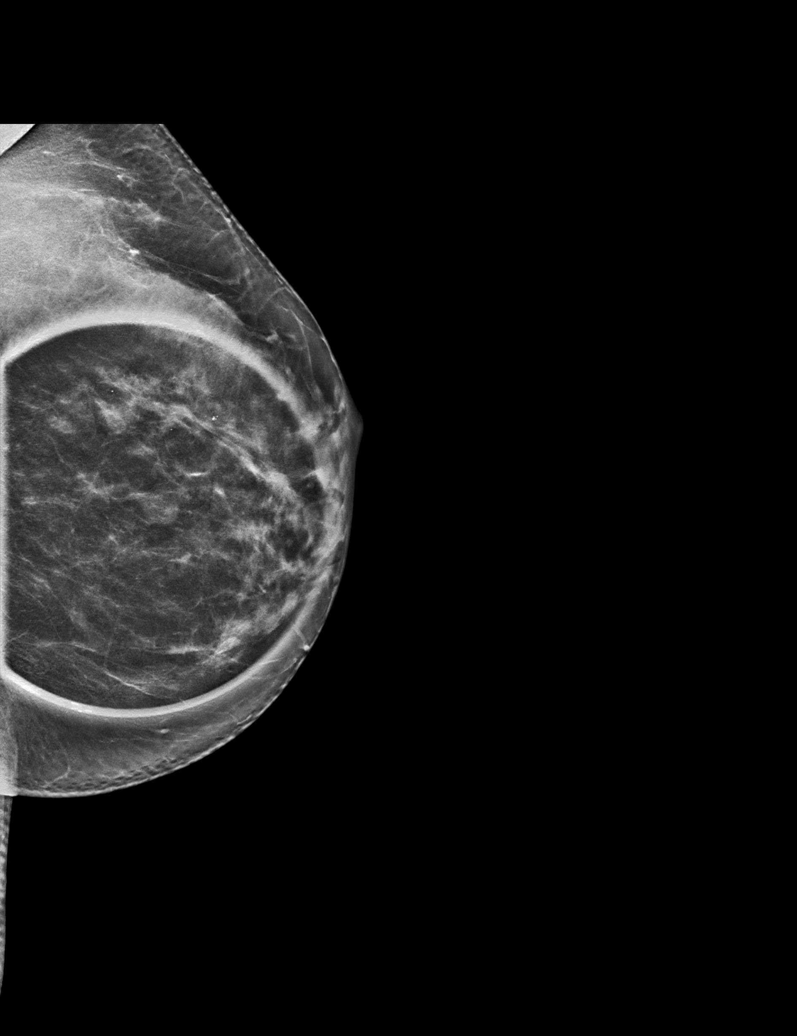

[L CC synth-2D (2 of 2)]
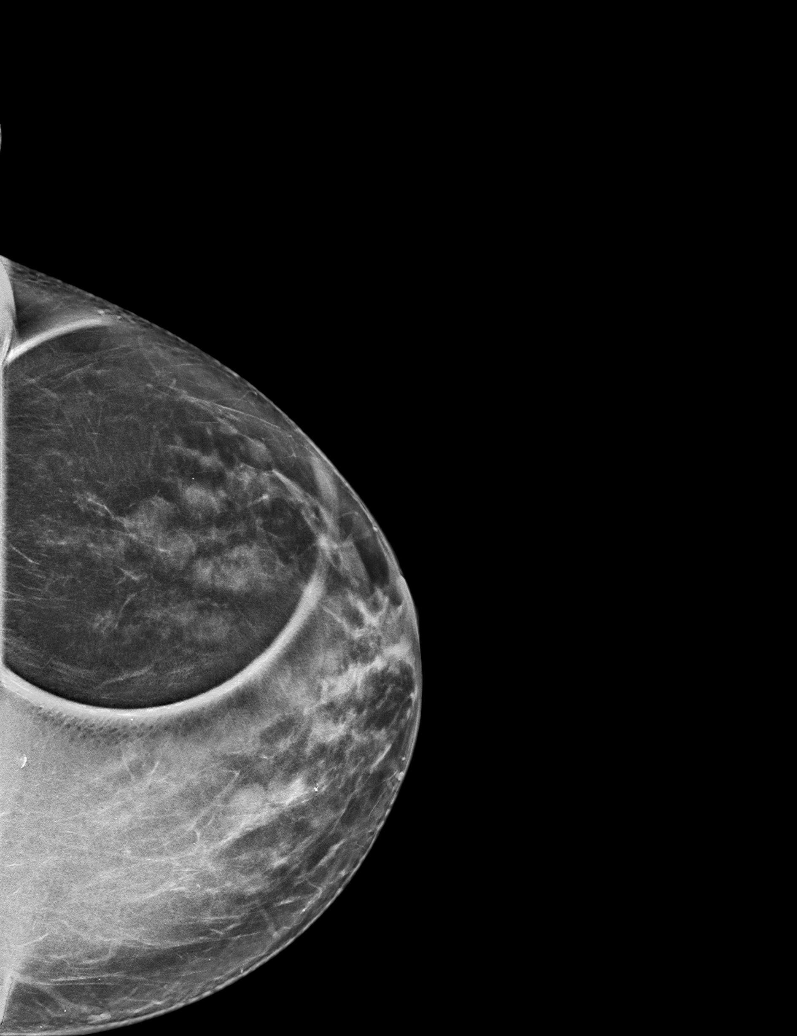

[R MLO synth-2D]
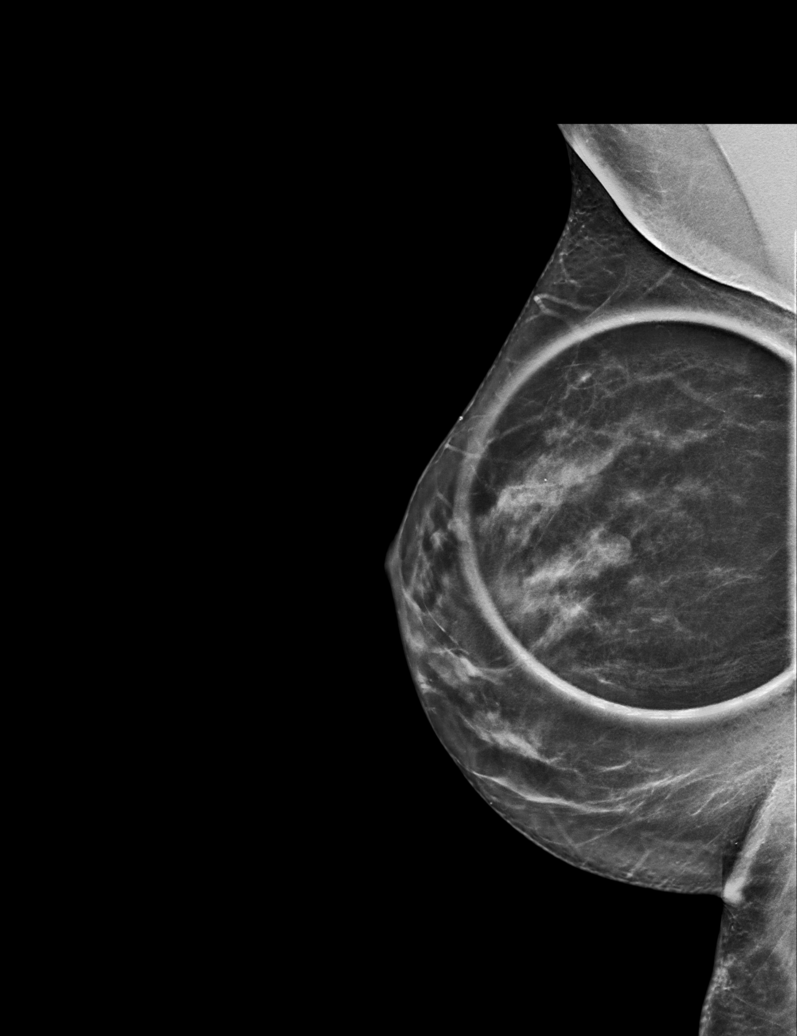

[6 of 36 positions shown; findings below may reference images not displayed]

ACR Breast Density Category c: The breast tissue is heterogeneously
dense, which may obscure small masses.
FINDINGS: 3D tomographic and 2D generated spot compression views of both
breasts were obtained. These confirm a small, oval, circumscribed
mass in the central right breast. There are 2 adjacent similar
masses in the outer left breast and one similar mass in the inner
left breast.

Mammographic images were processed with CAD.

Targeted ultrasound is performed, showing a 7 mm cyst in the 12
o'clock position of the right breast, 1 cm from the nipple,
corresponding to the mammographic mass. There are additional smaller
cysts in that region of the breast.

There are adjacent 10 mm and 8 mm cysts in the 3 o'clock position of
the left breast, 3 cm from the nipple, corresponding to the 2
adjacent masses seen mammographically. One of these contains thin
partial internal septations. There are multiple additional smaller
cysts in that region of the left breast.

In the 9 o'clock position of the left breast, 2 cm from the nipple,
a 7 mm bilobed cyst is demonstrated, corresponding to the
mammographic mass. There is an 8 mm similar-appearing cyst in the 8
o'clock position of the left breast, 1 cm from the nipple.

No solid masses were seen in either breast.
IMPRESSION: Multiple bilateral benign breast cysts. No evidence of malignancy in
either breast.

RECOMMENDATION:
Bilateral screening mammogram in 1 year.

I have discussed the findings and recommendations with the patient.
Results were also provided in writing at the conclusion of the
visit. If applicable, a reminder letter will be sent to the patient
regarding the next appointment.

BI-RADS CATEGORY  2: Benign.

## 2020-04-06 ENCOUNTER — Ambulatory Visit (INDEPENDENT_AMBULATORY_CARE_PROVIDER_SITE_OTHER): Payer: Managed Care, Other (non HMO)

## 2020-04-06 ENCOUNTER — Other Ambulatory Visit: Payer: Self-pay

## 2020-04-06 ENCOUNTER — Ambulatory Visit (INDEPENDENT_AMBULATORY_CARE_PROVIDER_SITE_OTHER): Payer: Managed Care, Other (non HMO) | Admitting: Podiatry

## 2020-04-06 DIAGNOSIS — M79671 Pain in right foot: Secondary | ICD-10-CM

## 2020-04-06 DIAGNOSIS — M79672 Pain in left foot: Secondary | ICD-10-CM | POA: Diagnosis not present

## 2020-04-06 DIAGNOSIS — M722 Plantar fascial fibromatosis: Secondary | ICD-10-CM | POA: Diagnosis not present

## 2020-04-08 NOTE — Progress Notes (Signed)
  Subjective:  Patient ID: Alyssa Moody, female    DOB: 13-May-1966,  MRN: [id number]  Chief Complaint  Patient presents with  . Foot Pain    Bilateral plantar midfoot and heels. x1 yr. Pt stated, "I work 12 hour shifts. I wonder if I have plantar fasciitis. The L is worse than the R. L = 8/10, R = 2-4/10. The pain is worse when I wake up in the morning, and when I stand after sitting for awhile. I've tried changing shoes".    54 y.o. female presents with the above complaint. History confirmed with patient.   Objective:  Physical Exam: warm, good capillary refill, no trophic changes or ulcerative lesions, normal DP and PT pulses and normal sensory exam. Left Foot: Pain on palpation to the left heel at the medial calcaneal tubercle Right Foot: Pain on palpation of the right heel at the medial calcaneal tubercle  Radiographs: X-ray of both feet: Rectus foot type, there is a small calcaneal enthesophyte on the right foot none on the left foot Assessment:   1. Bilateral foot pain      Plan:  Patient was evaluated and treated and all questions answered.  Discussed the etiology and treatment options for plantar fasciitis including stretching, formal physical therapy, supportive shoegears such as a running shoe or sneaker, pre fabricated orthoses, injection therapy, and oral medications. We also discussed the role of surgical treatment of this for patients who do not improve after exhausting non-surgical treatment options.    -XR reviewed with patient -Educated patient on stretching and icing of the affected limb -Plantar fascial brace dispensed -Injection delivered to the plantar fascia of both feet. -Rx for meloxicam. Educated on use, risks and benefits of the medication  After sterile prep with povidone-iodine solution and alcohol, the bilateral heel was injected with 0.5cc 2% xylocaine plain, 0.5cc 0.5% marcaine plain, 5mg  triamcinolone acetonide, and 2mg  dexamethasone was injected  along medially plantar fascia at the insertion on the plantar calcaneus. The patient tolerated the procedure well without complication.    No follow-ups on file.

## 2021-03-30 ENCOUNTER — Other Ambulatory Visit: Payer: Self-pay | Admitting: Obstetrics and Gynecology

## 2021-03-30 DIAGNOSIS — Z1231 Encounter for screening mammogram for malignant neoplasm of breast: Secondary | ICD-10-CM

## 2021-03-30 DIAGNOSIS — Z1239 Encounter for other screening for malignant neoplasm of breast: Secondary | ICD-10-CM

## 2021-05-17 ENCOUNTER — Ambulatory Visit
Admission: RE | Admit: 2021-05-17 | Discharge: 2021-05-17 | Disposition: A | Discharge status: Home / Self Care | Payer: Managed Care, Other (non HMO) | Source: Ambulatory Visit | Attending: Obstetrics and Gynecology | Admitting: Obstetrics and Gynecology

## 2021-05-17 ENCOUNTER — Other Ambulatory Visit: Payer: Self-pay

## 2021-05-17 DIAGNOSIS — Z1231 Encounter for screening mammogram for malignant neoplasm of breast: Secondary | ICD-10-CM

## 2022-10-30 ENCOUNTER — Emergency Department (HOSPITAL_COMMUNITY)
Admission: EM | Admit: 2022-10-30 | Discharge: 2022-10-30 | Disposition: A | Discharge status: Home / Self Care | Payer: Commercial Managed Care - PPO | Attending: Emergency Medicine | Admitting: Emergency Medicine

## 2022-10-30 ENCOUNTER — Encounter (HOSPITAL_COMMUNITY): Payer: Self-pay

## 2022-10-30 ENCOUNTER — Other Ambulatory Visit: Payer: Self-pay

## 2022-10-30 DIAGNOSIS — L509 Urticaria, unspecified: Secondary | ICD-10-CM | POA: Diagnosis present

## 2022-10-30 LAB — CBC WITH DIFFERENTIAL/PLATELET
Abs Immature Granulocytes: 0.01 10*3/uL (ref 0.00–0.07)
Basophils Absolute: 0 10*3/uL (ref 0.0–0.1)
Basophils Relative: 1 %
Eosinophils Absolute: 0 10*3/uL (ref 0.0–0.5)
Eosinophils Relative: 1 %
HCT: 42 % (ref 36.0–46.0)
Hemoglobin: 13.6 g/dL (ref 12.0–15.0)
Immature Granulocytes: 0 %
Lymphocytes Relative: 29 %
Lymphs Abs: 1.3 10*3/uL (ref 0.7–4.0)
MCH: 29.9 pg (ref 26.0–34.0)
MCHC: 32.4 g/dL (ref 30.0–36.0)
MCV: 92.3 fL (ref 80.0–100.0)
Monocytes Absolute: 0.5 10*3/uL (ref 0.1–1.0)
Monocytes Relative: 10 %
Neutro Abs: 2.7 10*3/uL (ref 1.7–7.7)
Neutrophils Relative %: 59 %
Platelets: 253 10*3/uL (ref 150–400)
RBC: 4.55 MIL/uL (ref 3.87–5.11)
RDW: 13.7 % (ref 11.5–15.5)
WBC: 4.6 10*3/uL (ref 4.0–10.5)
nRBC: 0 % (ref 0.0–0.2)

## 2022-10-30 LAB — COMPREHENSIVE METABOLIC PANEL
ALT: 34 U/L (ref 0–44)
AST: 32 U/L (ref 15–41)
Albumin: 4.1 g/dL (ref 3.5–5.0)
Alkaline Phosphatase: 77 U/L (ref 38–126)
Anion gap: 5 (ref 5–15)
BUN: 16 mg/dL (ref 6–20)
CO2: 26 mmol/L (ref 22–32)
Calcium: 8.9 mg/dL (ref 8.9–10.3)
Chloride: 107 mmol/L (ref 98–111)
Creatinine, Ser: 0.72 mg/dL (ref 0.44–1.00)
GFR, Estimated: >60 mL/min (ref >60)
Glucose, Bld: 87 mg/dL (ref 70–99)
Potassium: 3.8 mmol/L (ref 3.5–5.1)
Sodium: 138 mmol/L (ref 135–145)
Total Bilirubin: 0.4 mg/dL (ref 0.3–1.2)
Total Protein: 7.2 g/dL (ref 6.5–8.1)

## 2022-10-30 MED ORDER — DIPHENHYDRAMINE HCL 25 MG PO CAPS
25.0000 mg | ORAL_CAPSULE | Freq: Once | ORAL | Status: AC
  Administered 2022-10-30: 25 mg via ORAL
  Filled 2022-10-30: qty 1

## 2022-10-30 MED ORDER — FAMOTIDINE 20 MG PO TABS
20.0000 mg | ORAL_TABLET | Freq: Once | ORAL | Status: AC
  Administered 2022-10-30: 20 mg via ORAL
  Filled 2022-10-30: qty 1

## 2022-10-30 MED ORDER — PREDNISONE 20 MG PO TABS
40.0000 mg | ORAL_TABLET | Freq: Once | ORAL | Status: AC
  Administered 2022-10-30: 40 mg via ORAL
  Filled 2022-10-30: qty 2

## 2022-10-30 MED ORDER — PREDNISONE 20 MG PO TABS: 40.0000 mg | ORAL_TABLET | Freq: Every day | ORAL | 0 refills | Status: AC

## 2022-10-30 MED ORDER — EPINEPHRINE 0.3 MG/0.3ML IJ SOAJ: 0.3000 mg | INTRAMUSCULAR | 0 refills | Status: AC | PRN

## 2022-10-30 NOTE — ED Notes (Signed)
Pt remains alert, NAD, calm, interactive. Denies sx or complaints, questions or needs. "Itching, rash have resolved".

## 2022-10-30 NOTE — ED Triage Notes (Addendum)
Patient reports she noticed hives on Friday, all over extremities in different places. States she took benadryl for itching last night and then this morning took some as well. Large hives on lower extremities noted. Denies trying anything new or any known allergies.

## 2022-10-30 NOTE — Discharge Instructions (Signed)
You were seen in the emergency department today for a rash.  I am prescribing you 4 more days of steroids to take to help reduce your symptoms.  Please continue to take Benadryl on a schedule.  I am also prescribing you an epinephrine pen.  If you were to begin having swelling of your lips or tongue or difficulty breathing you will need to use this medication and call 911 to present back to the emergency department.  Please call tomorrow to your primary care to set up a follow-up appointment to have a referral to allergy testing.  Please return if you have worsening symptoms, shortness of breath, difficulty breathing, swelling of your lips or tongue, rash that involves the vagina or vulva, mouth or eyes.

## 2022-10-30 NOTE — ED Provider Notes (Signed)
[place] EMERGENCY DEPARTMENT AT [facility] Provider Note   CSN: [id number] Arrival date & time: 10/30/22  4401     History  Chief Complaint  Patient presents with   Urticaria    Alyssa Moody is a 57 y.o. female.  With past medical history of hiatal hernia who presents to the emergency department with urticaria.  Patient states that urticaria began on Friday.  She states that she woke up and had rash to her arms.  She states that she took some Benadryl and this essentially went down.  She states that then yesterday at work she had another flareup of rash and a coworker gave her some Benadryl and again the rash went down.  She states that this morning she woke up and was getting dressed for work.  She had some itching on her hips bilaterally and then looked down to see significant rash to both of her thighs.  She states that it does itch.  She denies involvement of her mouth, eyes or vagina.  She states that she was sick on Monday with nausea vomiting diarrhea and fever.  This was a less than 24-hour illness that resolved spontaneously.  She denies any new detergents, soaps, lotions, make-up, linens, new foods, recent travel, camping or hiking.  She denies any recent antibiotic use or other new medications.   Urticaria       Home Medications Prior to Admission medications   Medication Sig Start Date End Date Taking? Authorizing Provider  EPINEPHrine 0.3 mg/0.3 mL IJ SOAJ injection Inject 0.3 mg into the muscle as needed for anaphylaxis. 10/30/22  Yes Cristopher Peru, PA-C  predniSONE (DELTASONE) 20 MG tablet Take 2 tablets (40 mg total) by mouth daily for 4 days. 10/30/22 11/03/22 Yes Cristopher Peru, PA-C      Allergies    Patient has no known allergies.    Review of Systems   Review of Systems  Skin:  Positive for rash.  All other systems reviewed and are negative.   Physical Exam Updated Vital Signs BP 126/82   Pulse 89   Temp (!) 97.4 F (36.3 C) (Oral)    Resp 18   Ht 5\' 2"  (1.575 m)   Wt 56.7 kg   LMP 04/27/2015   SpO2 100%   BMI 22.86 kg/m  Physical Exam Vitals and nursing note reviewed.  Constitutional:      General: She is not in acute distress.    Appearance: Normal appearance. She is normal weight. She is not ill-appearing or toxic-appearing.  HENT:     Head: Normocephalic.     Mouth/Throat:     Mouth: Mucous membranes are moist.     Pharynx: Oropharynx is clear.     Comments: No lesions in the mouth There is no angioedema or respiratory distress. Eyes:     General: No scleral icterus.    Extraocular Movements: Extraocular movements intact.     Pupils: Pupils are equal, round, and reactive to light.  Cardiovascular:     Rate and Rhythm: Normal rate and regular rhythm.     Pulses: Normal pulses.     Heart sounds: No murmur heard. Pulmonary:     Effort: Pulmonary effort is normal. No respiratory distress.     Breath sounds: Normal breath sounds. No stridor.  Abdominal:     General: Bowel sounds are normal.     Palpations: Abdomen is soft.  Musculoskeletal:        General: Normal range of motion.  Comments: No swelling or erythema of the joints  Skin:    General: Skin is warm and dry.     Capillary Refill: Capillary refill takes less than 2 seconds.     Findings: Rash present.  Neurological:     General: No focal deficit present.     Mental Status: She is alert and oriented to person, place, and time. Mental status is at baseline.  Psychiatric:        Mood and Affect: Mood normal.        Behavior: Behavior normal.        Thought Content: Thought content normal.        Judgment: Judgment normal.        ED Results / Procedures / Treatments   Labs (all labs ordered are listed, but only abnormal results are displayed) Labs Reviewed  COMPREHENSIVE METABOLIC PANEL  CBC WITH DIFFERENTIAL/PLATELET    EKG None  Radiology No results found.  Procedures Procedures   Medications Ordered in  ED Medications  predniSONE (DELTASONE) tablet 40 mg (40 mg Oral Given 10/30/22 0739)  diphenhydrAMINE (BENADRYL) capsule 25 mg (25 mg Oral Given 10/30/22 0739)  famotidine (PEPCID) tablet 20 mg (20 mg Oral Given 10/30/22 1610)    ED Course/ Medical Decision Making/ A&P   {    Medical Decision Making Amount and/or Complexity of Data Reviewed Labs: ordered.  Risk Prescription drug management.  Initial Impression and Ddx 57 year old female who presents to the emergency department with rash. Patient PMH that increases complexity of ED encounter: Hiatal hernia repair Differential: Contact dermatitis, vasculitis, SJS, TEN, scalded staph syndrome, eczema multiforme, viral exanthem, etc.  Interpretation of Diagnostics I independent reviewed and interpreted the labs as followed: CBC and BMP unremarkable  - I independently visualized the following imaging with scope of interpretation limited to determining acute life threatening conditions related to emergency care: Not indicated  Patient Reassessment and Ultimate Disposition/Management 57 year old who presents with rash.  The picture that I placed in her physical exam that is on her phone is from this morning.  At that 1 below it is at current.  It is a significant rash.  Will just check her basic labs while giving her Benadryl, Pepcid and steroids.  I reassessed the patient.  She has improvement in her itching.  Rash is minimal at this time.  She has no involvement of the oropharynx.  There is no angioedema or evidence of anaphylaxis on her history or exam.  Her lungs are clear without wheezing.  There is no involvement of the mucous membranes concerning for SJS or TEN.  There is no skin sloughing.  Symptoms are inconsistent with a scalded staph syndrome.  No recent illnesses concerning for viral exanthem.  Rash is not consistent with a vasculitis or erythema multiforme or tickborne illness  It does appear to be urticarial in nature based on  the picture that she provided to me and obsessing her at bedside.  I am going to prescribe her 4 more days of steroids.  I have instructed her to continue using Benadryl on a schedule.  I have also prescribed an EpiPen if this were to progress.  I have instructed her to follow-up with her primary care tomorrow to schedule an appointment to have referral to allergy testing.  She is given strict return precautions for any involvement of the mucous membranes or if she begins to have lip or tongue swelling.  She verbalized understanding.  At this time however I feel that  she is safe for discharge.  Patient management required discussion with the following services or consulting groups:  None  Complexity of Problems Addressed Acute complicated illness or Injury  Additional Data Reviewed and Analyzed Further history obtained from: Past medical history and medications listed in the EMR and Care Everywhere  Patient Encounter Risk Assessment Prescriptions  Final Clinical Impression(s) / ED Diagnoses Final diagnoses:  Urticaria    Rx / DC Orders ED Discharge Orders          Ordered    EPINEPHrine 0.3 mg/0.3 mL IJ SOAJ injection  As needed        10/30/22 0941    predniSONE (DELTASONE) 20 MG tablet  Daily        10/30/22 0941              Cristopher Peru, PA-C 10/30/22 0944    Linwood Dibbles, MD 10/31/22 [phone]

## 2023-07-19 ENCOUNTER — Other Ambulatory Visit (HOSPITAL_COMMUNITY): Payer: Self-pay | Admitting: Physician Assistant

## 2023-07-19 DIAGNOSIS — E7800 Pure hypercholesterolemia, unspecified: Secondary | ICD-10-CM

## 2023-07-28 ENCOUNTER — Encounter (HOSPITAL_COMMUNITY): Payer: Self-pay

## 2023-07-28 ENCOUNTER — Ambulatory Visit (HOSPITAL_COMMUNITY): Payer: Commercial Managed Care - PPO

## 2023-08-02 ENCOUNTER — Ambulatory Visit (HOSPITAL_COMMUNITY)
Admission: RE | Admit: 2023-08-02 | Discharge: 2023-08-02 | Disposition: A | Discharge status: Home / Self Care | Payer: Self-pay | Source: Ambulatory Visit | Attending: Physician Assistant | Admitting: Physician Assistant

## 2023-08-02 DIAGNOSIS — E7800 Pure hypercholesterolemia, unspecified: Secondary | ICD-10-CM | POA: Insufficient documentation
